# Patient Record
Sex: Female | Born: 1995 | Race: White | Hispanic: No | Marital: Single | Smoking: Current every day smoker
Health system: Southern US, Community
[De-identification: ages and names within clinical notes are randomized; demographics above are authoritative.]

## PROBLEM LIST (undated history)

## (undated) DIAGNOSIS — O99345 Other mental disorders complicating the puerperium: Principal | ICD-10-CM

## (undated) DIAGNOSIS — F419 Anxiety disorder, unspecified: Secondary | ICD-10-CM

## (undated) DIAGNOSIS — F53 Postpartum depression: Secondary | ICD-10-CM

## (undated) HISTORY — DX: Other mental disorders complicating the puerperium: O99.345

## (undated) HISTORY — PX: TONSILLECTOMY: SUR1361

## (undated) HISTORY — PX: MYRINGOTOMY: SUR874

## (undated) HISTORY — DX: Postpartum depression: F53.0

---
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
## 2014-01-11 ENCOUNTER — Encounter: Payer: Self-pay | Admitting: Obstetrics & Gynecology

## 2014-01-11 ENCOUNTER — Ambulatory Visit (INDEPENDENT_AMBULATORY_CARE_PROVIDER_SITE_OTHER): Payer: Medicaid Other | Admitting: Obstetrics & Gynecology

## 2014-01-11 VITALS — BP 110/60 | Wt 112.0 lb

## 2014-01-11 DIAGNOSIS — Z3400 Encounter for supervision of normal first pregnancy, unspecified trimester: Secondary | ICD-10-CM

## 2014-01-11 DIAGNOSIS — Z1389 Encounter for screening for other disorder: Secondary | ICD-10-CM

## 2014-01-11 DIAGNOSIS — Z331 Pregnant state, incidental: Secondary | ICD-10-CM

## 2014-01-11 DIAGNOSIS — IMO0002 Reserved for concepts with insufficient information to code with codable children: Secondary | ICD-10-CM

## 2014-01-11 DIAGNOSIS — O365990 Maternal care for other known or suspected poor fetal growth, unspecified trimester, not applicable or unspecified: Secondary | ICD-10-CM

## 2014-01-11 LAB — POCT URINALYSIS DIPSTICK
Blood, UA: NEGATIVE
Glucose, UA: NEGATIVE
Ketones, UA: NEGATIVE
Nitrite, UA: NEGATIVE

## 2014-01-11 MED ORDER — METRONIDAZOLE 500 MG PO TABS: 500.0000 mg | ORAL_TABLET | Freq: Two times a day (BID) | ORAL | Status: DC

## 2014-01-11 NOTE — Progress Notes (Signed)
G1P0 [redacted]w[redacted]d Estimated Date of Delivery: 02/19/14  Blood pressure 110/60, weight 112 lb (50.803 kg), last menstrual period 05/15/2013.   BP weight and urine results all reviewed and noted.  Please refer to the obstetrical flow sheet for the fundal height and fetal heart rate documentation:  Patient reports good fetal movement, denies any bleeding and no rupture of membranes symptoms or regular contractions. Patient is without complaints. All questions were answered.  Having a foul smelling discharge, will treat with metronidazole 500 BID x 7 days FH is still lagging, sonogram at 30 weeks 24%tile, will recheck, weight gain has stalled  Plan:  Continued routine obstetrical care, recheck sonogram for growth  Follow up in 2 weeks for OB appointment, + sonogram

## 2014-01-24 ENCOUNTER — Encounter: Payer: Medicaid Other | Admitting: Women's Health

## 2014-01-24 ENCOUNTER — Other Ambulatory Visit: Payer: Medicaid Other

## 2014-01-25 ENCOUNTER — Other Ambulatory Visit: Payer: Medicaid Other

## 2014-01-25 ENCOUNTER — Encounter: Payer: Medicaid Other | Admitting: Advanced Practice Midwife

## 2014-01-29 ENCOUNTER — Encounter: Payer: Medicaid Other | Admitting: Obstetrics & Gynecology

## 2014-01-29 ENCOUNTER — Other Ambulatory Visit: Payer: Medicaid Other

## 2014-02-02 ENCOUNTER — Other Ambulatory Visit: Payer: Self-pay | Admitting: Obstetrics & Gynecology

## 2014-02-02 ENCOUNTER — Encounter: Payer: Self-pay | Admitting: Obstetrics & Gynecology

## 2014-02-02 ENCOUNTER — Ambulatory Visit (INDEPENDENT_AMBULATORY_CARE_PROVIDER_SITE_OTHER): Payer: Medicaid Other | Admitting: Obstetrics & Gynecology

## 2014-02-02 ENCOUNTER — Ambulatory Visit (INDEPENDENT_AMBULATORY_CARE_PROVIDER_SITE_OTHER): Payer: Medicaid Other

## 2014-02-02 VITALS — BP 120/80 | Wt 116.0 lb

## 2014-02-02 DIAGNOSIS — Z1389 Encounter for screening for other disorder: Secondary | ICD-10-CM

## 2014-02-02 DIAGNOSIS — O365990 Maternal care for other known or suspected poor fetal growth, unspecified trimester, not applicable or unspecified: Secondary | ICD-10-CM

## 2014-02-02 DIAGNOSIS — IMO0002 Reserved for concepts with insufficient information to code with codable children: Secondary | ICD-10-CM

## 2014-02-02 DIAGNOSIS — Z331 Pregnant state, incidental: Secondary | ICD-10-CM

## 2014-02-02 DIAGNOSIS — Z3403 Encounter for supervision of normal first pregnancy, third trimester: Secondary | ICD-10-CM

## 2014-02-02 DIAGNOSIS — O09899 Supervision of other high risk pregnancies, unspecified trimester: Secondary | ICD-10-CM

## 2014-02-02 LAB — POCT URINALYSIS DIPSTICK
Blood, UA: NEGATIVE
Glucose, UA: NEGATIVE
Ketones, UA: NEGATIVE
NITRITE UA: NEGATIVE
Protein, UA: NEGATIVE

## 2014-02-02 LAB — OB RESULTS CONSOLE GC/CHLAMYDIA
CHLAMYDIA, DNA PROBE: NEGATIVE
GC PROBE AMP, GENITAL: NEGATIVE

## 2014-02-02 NOTE — Progress Notes (Signed)
U/S(37+4wks)-vtx active fetus EFw 5 lb 7 oz (5th%tile), fluid WNL AFI-10.0cm SDP-3.3cm, female fetus, FHR-122 bpm, UA Doppler RI-0.59 & 0.49, BPP 8/8

## 2014-02-02 NOTE — Progress Notes (Signed)
High Risk Pregnancy Diagnosis(es):   IUGR  G1P0 442w4d Estimated Date of Delivery: 02/19/14  Blood pressure 120/80, weight 116 lb (52.617 kg), last menstrual period 05/15/2013.  Urinalysis: Negative   HPI: Sonogram is reviewed and report done.  IUGR with no associated complicating factors.  Induction scheduled for 02/12/2014     BP weight and urine results all reviewed and noted. Patient reports good fetal movement, denies any bleeding and no rupture of membranes symptoms or regular contractions.  Fundal Height:  31 Fetal Heart rate:  122 Edema:  none  Patient is without complaints. All questions were answered.  Assessment:  9342w4d,   IUGR  Medication(s) Plans:  na  Treatment Plan:  Twice weekly assessments and induction at 39 weeks, scheduled 02/12/2014  Follow up on Monday for NST + visit and sonogram next Thursday

## 2014-02-03 LAB — GC/CHLAMYDIA PROBE AMP
CT Probe RNA: NEGATIVE
GC Probe RNA: NEGATIVE

## 2014-02-03 LAB — STREP B DNA PROBE: STREP GROUP B AG: NOT DETECTED

## 2014-02-05 ENCOUNTER — Ambulatory Visit (INDEPENDENT_AMBULATORY_CARE_PROVIDER_SITE_OTHER): Payer: Medicaid Other | Admitting: Obstetrics & Gynecology

## 2014-02-05 ENCOUNTER — Encounter: Payer: Self-pay | Admitting: Obstetrics & Gynecology

## 2014-02-05 ENCOUNTER — Other Ambulatory Visit: Payer: Medicaid Other | Admitting: Obstetrics & Gynecology

## 2014-02-05 ENCOUNTER — Other Ambulatory Visit: Payer: Medicaid Other

## 2014-02-05 ENCOUNTER — Other Ambulatory Visit: Payer: Self-pay | Admitting: *Deleted

## 2014-02-05 VITALS — BP 110/80 | Wt 116.0 lb

## 2014-02-05 DIAGNOSIS — Z283 Underimmunization status: Secondary | ICD-10-CM

## 2014-02-05 DIAGNOSIS — IMO0002 Reserved for concepts with insufficient information to code with codable children: Secondary | ICD-10-CM

## 2014-02-05 DIAGNOSIS — O09899 Supervision of other high risk pregnancies, unspecified trimester: Secondary | ICD-10-CM

## 2014-02-05 DIAGNOSIS — Z3403 Encounter for supervision of normal first pregnancy, third trimester: Secondary | ICD-10-CM

## 2014-02-05 DIAGNOSIS — Z1389 Encounter for screening for other disorder: Secondary | ICD-10-CM

## 2014-02-05 DIAGNOSIS — Z331 Pregnant state, incidental: Secondary | ICD-10-CM

## 2014-02-05 DIAGNOSIS — O365990 Maternal care for other known or suspected poor fetal growth, unspecified trimester, not applicable or unspecified: Secondary | ICD-10-CM

## 2014-02-05 LAB — POCT URINALYSIS DIPSTICK
Glucose, UA: NEGATIVE
Ketones, UA: NEGATIVE
NITRITE UA: NEGATIVE
Protein, UA: NEGATIVE
RBC UA: NEGATIVE

## 2014-02-05 MED ORDER — ZOLPIDEM TARTRATE 10 MG PO TABS: 10.0000 mg | ORAL_TABLET | Freq: Every evening | ORAL | Status: DC | PRN

## 2014-02-05 NOTE — Progress Notes (Signed)
Reactive NST   High Risk Pregnancy Diagnosis(es):   IUGR without complicating factors  G1P0 4047w0d Estimated Date of Delivery: 02/19/14  Blood pressure 110/80, weight 116 lb (52.617 kg), last menstrual period 05/15/2013.  Urinalysis: Negative   HPI: See below     BP weight and urine results all reviewed and noted. Patient reports good fetal movement, denies any bleeding and no rupture of membranes symptoms or regular contractions.  Fundal Height:  33 Fetal Heart rate:  135 Edema:  none  Patient is without complaints. All questions were answered.  Assessment:  8047w0d,   IUGR  Medication(s) Plans:  ambien 10 mg qhs prn sleep  Treatment Plan:  Induction next week, sonogram + ob visit thursday  Follow up in thursday for OB appt, sonogram

## 2014-02-07 ENCOUNTER — Encounter (HOSPITAL_COMMUNITY): Payer: Self-pay | Admitting: *Deleted

## 2014-02-07 ENCOUNTER — Telehealth (HOSPITAL_COMMUNITY): Payer: Self-pay | Admitting: *Deleted

## 2014-02-07 NOTE — Telephone Encounter (Signed)
Preadmission screen  

## 2014-02-08 ENCOUNTER — Other Ambulatory Visit: Payer: Self-pay | Admitting: Obstetrics & Gynecology

## 2014-02-08 ENCOUNTER — Ambulatory Visit (INDEPENDENT_AMBULATORY_CARE_PROVIDER_SITE_OTHER): Payer: Medicaid Other | Admitting: Obstetrics & Gynecology

## 2014-02-08 ENCOUNTER — Ambulatory Visit (INDEPENDENT_AMBULATORY_CARE_PROVIDER_SITE_OTHER): Payer: Medicaid Other

## 2014-02-08 ENCOUNTER — Encounter: Payer: Self-pay | Admitting: Obstetrics & Gynecology

## 2014-02-08 VITALS — BP 110/70 | Wt 117.0 lb

## 2014-02-08 DIAGNOSIS — IMO0002 Reserved for concepts with insufficient information to code with codable children: Secondary | ICD-10-CM

## 2014-02-08 DIAGNOSIS — Z1389 Encounter for screening for other disorder: Secondary | ICD-10-CM

## 2014-02-08 DIAGNOSIS — Z331 Pregnant state, incidental: Secondary | ICD-10-CM

## 2014-02-08 DIAGNOSIS — O365990 Maternal care for other known or suspected poor fetal growth, unspecified trimester, not applicable or unspecified: Secondary | ICD-10-CM

## 2014-02-08 DIAGNOSIS — O09899 Supervision of other high risk pregnancies, unspecified trimester: Secondary | ICD-10-CM

## 2014-02-08 LAB — POCT URINALYSIS DIPSTICK
Glucose, UA: NEGATIVE
Ketones, UA: NEGATIVE
Nitrite, UA: NEGATIVE
Protein, UA: NEGATIVE

## 2014-02-08 MED ORDER — FLUCONAZOLE 150 MG PO TABS: 150.0000 mg | ORAL_TABLET | Freq: Once | ORAL | Status: DC

## 2014-02-08 NOTE — Progress Notes (Signed)
U/S(38+3wks)-vtx active fetus, BPP 8/8, fluid WNL AFI-10.2cm SDP-4.7cm, posterior/fundal Gr 2 placenta, FHR-150 & 126 bpm, UA Doppler RI-0.66 & 0.63

## 2014-02-08 NOTE — Progress Notes (Signed)
Sonogram is reviewed and read, report done  Induction set for 02/12/2014     High Risk Pregnancy Diagnosis(es):   IUGR  G1P0 4061w3d Estimated Date of Delivery: 02/19/14  Blood pressure 110/70, weight 117 lb (53.071 kg), last menstrual period 05/15/2013.  Urinalysis: Negative   HPI: Sonogram is ok for continuation of pregnancy until induction date     BP weight and urine results all reviewed and noted. Patient reports good fetal movement, denies any bleeding and no rupture of membranes symptoms or regular contractions.  Fundal Height:  32  Fetal Heart rate:  150 Edema:  trace  Patient is without complaints. All questions were answered.  Assessment:  5461w3d,   IUGR  Medication(s) Plans:    Treatment Plan:  Ripen as planned  PP 6 weeks

## 2014-02-12 ENCOUNTER — Inpatient Hospital Stay (HOSPITAL_COMMUNITY)
Admission: RE | Admit: 2014-02-12 | Discharge: 2014-02-15 | DRG: 775 | Disposition: A | Discharge status: Home / Self Care | Payer: Medicaid Other | Source: Ambulatory Visit | Attending: Obstetrics & Gynecology | Admitting: Obstetrics & Gynecology

## 2014-02-12 ENCOUNTER — Encounter (HOSPITAL_COMMUNITY): Payer: Self-pay

## 2014-02-12 VITALS — BP 112/64 | HR 78 | Temp 98.3°F | Resp 16 | Ht 66.0 in | Wt 117.0 lb

## 2014-02-12 DIAGNOSIS — Z3490 Encounter for supervision of normal pregnancy, unspecified, unspecified trimester: Secondary | ICD-10-CM

## 2014-02-12 DIAGNOSIS — Z283 Underimmunization status: Secondary | ICD-10-CM

## 2014-02-12 DIAGNOSIS — Z3403 Encounter for supervision of normal first pregnancy, third trimester: Secondary | ICD-10-CM

## 2014-02-12 DIAGNOSIS — O365990 Maternal care for other known or suspected poor fetal growth, unspecified trimester, not applicable or unspecified: Secondary | ICD-10-CM | POA: Diagnosis present

## 2014-02-12 DIAGNOSIS — O09899 Supervision of other high risk pregnancies, unspecified trimester: Secondary | ICD-10-CM

## 2014-02-12 LAB — RAPID URINE DRUG SCREEN, HOSP PERFORMED
Amphetamines: NOT DETECTED
BARBITURATES: NOT DETECTED
Benzodiazepines: NOT DETECTED
Cocaine: NOT DETECTED
Opiates: NOT DETECTED
Tetrahydrocannabinol: NOT DETECTED

## 2014-02-12 LAB — CBC
HEMATOCRIT: 35.4 % — AB (ref 36.0–46.0)
Hemoglobin: 12.1 g/dL (ref 12.0–15.0)
MCH: 32.1 pg (ref 26.0–34.0)
MCHC: 34.2 g/dL (ref 30.0–36.0)
MCV: 93.9 fL (ref 78.0–100.0)
PLATELETS: 159 10*3/uL (ref 150–400)
RBC: 3.77 MIL/uL — AB (ref 3.87–5.11)
RDW: 13.5 % (ref 11.5–15.5)
WBC: 9.8 10*3/uL (ref 4.0–10.5)

## 2014-02-12 LAB — RPR

## 2014-02-12 MED ORDER — EPHEDRINE 5 MG/ML INJ
10.0000 mg | INTRAVENOUS | Status: DC | PRN
  Filled 2014-02-12: qty 2

## 2014-02-12 MED ORDER — ACETAMINOPHEN 325 MG PO TABS
650.0000 mg | ORAL_TABLET | ORAL | Status: DC | PRN
  Administered 2014-02-12: 650 mg via ORAL
  Filled 2014-02-12: qty 2

## 2014-02-12 MED ORDER — FENTANYL CITRATE 0.05 MG/ML IJ SOLN
100.0000 ug | INTRAMUSCULAR | Status: DC | PRN
  Administered 2014-02-12 (×2): 100 ug via INTRAVENOUS
  Filled 2014-02-12 (×2): qty 2

## 2014-02-12 MED ORDER — BUTORPHANOL TARTRATE 1 MG/ML IJ SOLN
INTRAMUSCULAR | Status: AC
  Filled 2014-02-12: qty 1

## 2014-02-12 MED ORDER — TERBUTALINE SULFATE 1 MG/ML IJ SOLN: 0.2500 mg | Freq: Once | INTRAMUSCULAR | Status: AC | PRN

## 2014-02-12 MED ORDER — CITRIC ACID-SODIUM CITRATE 334-500 MG/5ML PO SOLN: 30.0000 mL | ORAL | Status: DC | PRN

## 2014-02-12 MED ORDER — OXYCODONE-ACETAMINOPHEN 5-325 MG PO TABS: 1.0000 | ORAL_TABLET | ORAL | Status: DC | PRN

## 2014-02-12 MED ORDER — OXYTOCIN 40 UNITS IN LACTATED RINGERS INFUSION - SIMPLE MED: 62.5000 mL/h | INTRAVENOUS | Status: DC

## 2014-02-12 MED ORDER — DIPHENHYDRAMINE HCL 50 MG/ML IJ SOLN
12.5000 mg | INTRAMUSCULAR | Status: DC | PRN
  Administered 2014-02-13 (×2): 12.5 mg via INTRAVENOUS
  Filled 2014-02-12 (×2): qty 1

## 2014-02-12 MED ORDER — OXYTOCIN BOLUS FROM INFUSION: 500.0000 mL | INTRAVENOUS | Status: DC

## 2014-02-12 MED ORDER — LACTATED RINGERS IV SOLN
INTRAVENOUS | Status: DC
  Administered 2014-02-12 – 2014-02-13 (×2): via INTRAVENOUS

## 2014-02-12 MED ORDER — FENTANYL 2.5 MCG/ML BUPIVACAINE 1/10 % EPIDURAL INFUSION (WH - ANES)
14.0000 mL/h | INTRAMUSCULAR | Status: DC | PRN
  Administered 2014-02-13 (×2): 14 mL/h via EPIDURAL
  Filled 2014-02-12 (×2): qty 125

## 2014-02-12 MED ORDER — ONDANSETRON HCL 4 MG/2ML IJ SOLN: 4.0000 mg | Freq: Four times a day (QID) | INTRAMUSCULAR | Status: DC | PRN

## 2014-02-12 MED ORDER — MISOPROSTOL 25 MCG QUARTER TABLET
25.0000 ug | ORAL_TABLET | ORAL | Status: DC | PRN
  Administered 2014-02-12: 25 ug via VAGINAL
  Filled 2014-02-12: qty 0.25

## 2014-02-12 MED ORDER — BUTORPHANOL TARTRATE 1 MG/ML IJ SOLN
1.0000 mg | Freq: Once | INTRAMUSCULAR | Status: AC
  Administered 2014-02-12: 1 mg via INTRAVENOUS

## 2014-02-12 MED ORDER — IBUPROFEN 600 MG PO TABS
600.0000 mg | ORAL_TABLET | Freq: Four times a day (QID) | ORAL | Status: DC | PRN
  Administered 2014-02-13: 600 mg via ORAL
  Filled 2014-02-12: qty 1

## 2014-02-12 MED ORDER — PHENYLEPHRINE 40 MCG/ML (10ML) SYRINGE FOR IV PUSH (FOR BLOOD PRESSURE SUPPORT)
80.0000 ug | PREFILLED_SYRINGE | INTRAVENOUS | Status: DC | PRN
  Filled 2014-02-12: qty 2
  Filled 2014-02-12: qty 10

## 2014-02-12 MED ORDER — LIDOCAINE HCL (PF) 1 % IJ SOLN
30.0000 mL | INTRAMUSCULAR | Status: DC | PRN
  Filled 2014-02-12: qty 30

## 2014-02-12 MED ORDER — OXYTOCIN 40 UNITS IN LACTATED RINGERS INFUSION - SIMPLE MED
1.0000 m[IU]/min | INTRAVENOUS | Status: DC
  Administered 2014-02-13: 2 m[IU]/min via INTRAVENOUS
  Filled 2014-02-12: qty 1000

## 2014-02-12 MED ORDER — FLEET ENEMA 7-19 GM/118ML RE ENEM: 1.0000 | ENEMA | RECTAL | Status: DC | PRN

## 2014-02-12 MED ORDER — PHENYLEPHRINE 40 MCG/ML (10ML) SYRINGE FOR IV PUSH (FOR BLOOD PRESSURE SUPPORT)
80.0000 ug | PREFILLED_SYRINGE | INTRAVENOUS | Status: DC | PRN
  Filled 2014-02-12: qty 2

## 2014-02-12 MED ORDER — LACTATED RINGERS IV SOLN
500.0000 mL | Freq: Once | INTRAVENOUS | Status: AC
  Administered 2014-02-13: 500 mL via INTRAVENOUS

## 2014-02-12 MED ORDER — LACTATED RINGERS IV SOLN: 500.0000 mL | INTRAVENOUS | Status: DC | PRN

## 2014-02-12 NOTE — Progress Notes (Signed)
I consulted with pt's RN, Venda RodesJennifer Cox and with CSW, Francesca JewettCollen Shaw.  I did not meet with patient today, but we remain available for emotional and spiritual support if additional support is needed for patient or for adoptive parents.  [address] Roosevelt StreetChaplain Katy Bateslandlaussen Pager, [phone] 4:28 PM   02/12/14 1600  Clinical Encounter Type  Visited With Health care provider (RN, Venda RodesJennifer Cox, CSW, Plankintonolleen Shaw)

## 2014-02-12 NOTE — Progress Notes (Signed)
CSW received call from alleged FOB's mother/Melinda Ernestine Mcmurrayngle who questioned CSW about fathers' rights.  Ms. Ernestine Mcmurrayngle was not requesting any information about patient and understood that CSW could only speak in general terms regarding adoptive situations.  CSW informed Ms. Ernestine Mcmurrayngle that her son has paternal rights if he is the biological father.  CSW advised she and her son speak to a lawyer regarding how to go about establishing paternity and provided her with phone numbers for Legal Aid and [facility].  Ms. Ernestine Mcmurrayngle is not in support of patient's decision to make an adoption plan and states she and her son want to raise the baby.  CSW has concerns that this baby will be in the middle of a disagreement between many parties at birth.  CSW will follow up tomorrow regarding baby's disposition.

## 2014-02-12 NOTE — H&P (Signed)
[name] [name] is a 18 y.o. female G1P0 with IUP at 5321w0d presenting for IOL 2/2 symmetric IUGR. Baby up for adoption. Pt states she has been having none contractions, associated with none vaginal bleeding.  Membranes are intact, with active fetal movement.   [facility]  since 13 wks  Prenatal History/Complications: Pt. Is a 18 y/o G1 at 39w here for IOL 2/2 symmetric IUGR. She has +FM, no bleeding, no LOF. She denies fever, chills, nausea, vomiting, or other systemic signs / symptoms. She is planning to put baby up for adoption. Adoptive parents to be at delivery. She has no other complications. She says that she does not smoke, or use other illicit substances. She has no other medical problems, and is only taking her prenatal vitamins. She is unsure of who the father is at this time. She has no other complaints.   Past Medical History: History reviewed. No pertinent past medical history.  Past Surgical History: Past Surgical History  Procedure Laterality Date  . Tonsillectomy      Obstetrical History: OB History   Grav Para Term Preterm Abortions TAB SAB Ect Mult Living   1         0      Gynecological History: OB History   Grav Para Term Preterm Abortions TAB SAB Ect Mult Living   1         0      Social History: History   Social History  . Marital Status: Single    Spouse Name: N/A    Number of Children: N/A  . Years of Education: N/A   Social History Main Topics  . Smoking status: Never Smoker   . Smokeless tobacco: Never Used  . Alcohol Use: No  . Drug Use: No  . Sexual Activity: Yes    Birth Control/ Protection: None   Other Topics Concern  . None   Social History Narrative  . None    Family History: Family History  Problem Relation Age of Onset  . Cancer Maternal Grandfather   . Alcohol abuse Neg Hx   . Arthritis Neg Hx   . Asthma Neg Hx   . COPD Neg Hx   . Birth defects Neg Hx   . Depression Neg Hx   . Diabetes Neg Hx   . Drug abuse  Neg Hx   . Early death Neg Hx   . Hearing loss Neg Hx   . Heart disease Neg Hx   . Hyperlipidemia Neg Hx   . Hypertension Neg Hx   . Kidney disease Neg Hx   . Learning disabilities Neg Hx   . Mental illness Neg Hx   . Miscarriages / Stillbirths Neg Hx   . Mental retardation Neg Hx   . Vision loss Neg Hx   . Stroke Neg Hx   . Varicose Veins Neg Hx     Allergies: No Known Allergies  Prescriptions prior to admission  Medication Sig Dispense Refill  . acetaminophen (TYLENOL) 325 MG tablet Take 650 mg by mouth every 6 (six) hours as needed for mild pain or headache.      . Prenatal Vit-Fe Fumarate-FA (PRENATAL MULTIVITAMIN) TABS tablet Take 1 tablet by mouth daily at 12 noon.         Review of Systems   Constitutional: per HPI above.   Blood pressure 115/75, pulse 89, temperature 98.2 F (36.8 C), temperature source Oral, resp. rate 18, height 5\' 6"  (1.676 m), weight 53.071 kg (117  lb), last menstrual period 05/15/2013. General appearance: alert, cooperative and no distress Lungs: clear to auscultation bilaterally Heart: regular rate and rhythm Abdomen: soft, non-tender; bowel sounds normal Pelvic: 1cm, 50%, -3 station Extremities: Homans sign is negative, no sign of DVT Neurologically grossly intact.  Presentation: cephalic Fetal monitoringBaseline: 140's bpm, Variability: Good {> 6 bpm) and Accelerations: Reactive Uterine activityNone Dilation: 1 Effacement (%): 50 Station: -2 Exam by:: Von Inscoe, CNM   Prenatal labs: ABO, Rh: O/POS/-- (01/05 1458) Antibody: NEG (04/23 1134) Rubella:   RPR: NON REAC (04/23 1134)  HBsAg: NEGATIVE (01/05 1458)  HIV: NONREACTIVE (04/23 1134)  GBS: NOT DETECTED (06/26 1329)  1 hr Glucola - Neg Genetic screening  - Neg Anatomy US - Normal female   Prenatal Transfer Tool  Maternal Diabetes: No Genetic Screening: Normal Maternal Ultrasounds/Referrals: Normal Fetal Ultrasounds or other Referrals:  None Maternal Substance Abuse:   No Significant Maternal Medications:  None Significant Maternal Lab Results: Lab values include: Group B Strep negative     Results for orders placed during the hospital encounter of 02/12/14 (from the past 24 hour(s))  CBC   Collection Time    02/12/14  8:00 AM      Result Value Ref Range   WBC 9.8  4.0 - 10.5 K/uL   RBC 3.77 (*) 3.87 - 5.11 MIL/uL   Hemoglobin 12.1  12.0 - 15.0 g/dL   HCT 78.235.4 (*) 95.636.0 - 21.346.0 %   MCV 93.9  78.0 - 100.0 fL   MCH 32.1  26.0 - 34.0 pg   MCHC 34.2  30.0 - 36.0 g/dL   RDW 08.613.5  57.811.5 - 46.915.5 %   Platelets 159  150 - 400 K/uL  URINE RAPID DRUG SCREEN (HOSP PERFORMED)   Collection Time    02/12/14  9:10 AM      Result Value Ref Range   Opiates NONE DETECTED  NONE DETECTED   Cocaine NONE DETECTED  NONE DETECTED   Benzodiazepines NONE DETECTED  NONE DETECTED   Amphetamines NONE DETECTED  NONE DETECTED   Tetrahydrocannabinol NONE DETECTED  NONE DETECTED   Barbiturates NONE DETECTED  NONE DETECTED    Assessment: [name] [name] is a 18 y.o. G1P0 at 1452w0d by L= 9 wk u/s here for IOL 2/2 symmetric IUGR #Labor: Plan for cervical ripening, with subsequent foley bulb placement, and augmentation with pitocin if necessary. Progression to NSVD. #Pain: Fentanyl, Epidural #FWB: Cat I #ID:  GBS Negative #MOF: N/A #MOC: Depo #Circ:  Adoptive Parents to [facility]  Melancon, Hillery HunterCaleb G 02/12/2014, 1:33 PM  Evaluation and management procedures were performed by Resident physician under my supervision/collaboration. Chart reviewed, patient examined by me and I agree with management and plan.

## 2014-02-12 NOTE — Progress Notes (Signed)
Sena HitchOlivia Thorman is a 18 y.o. G1P0 at 5459w0d by LMP admitted for induction of labor due to symmetric IUGR.  Subjective: Pt. Is complaining of more pain now, and is contracting more frequently q 1-4 minutes. She says that she is tired. She has no other complaints.   Objective: BP 115/75  Pulse 89  Temp(Src) 98.2 F (36.8 C) (Oral)  Resp 18  Ht 5\' 6"  (1.676 m)  Wt 53.071 kg (117 lb)  BMI 18.89 kg/m2  LMP 05/15/2013      FHT:  FHR: 130's bpm, variability: moderate,  accelerations:  Present,  decelerations:  Absent UC:   regular, every 1-4 minutes SVE:   Dilation: 1.5 Effacement (%): 80 Station: -2 Exam by:: Kimorah Ridolfi, CNM  Labs: Lab Results  Component Value Date   WBC 9.8 02/12/2014   HGB 12.1 02/12/2014   HCT 35.4* 02/12/2014   MCV 93.9 02/12/2014   PLT 159 02/12/2014    Assessment / Plan: Induction of Labor 2/2 IUGR progressing well. Holding pitocin for now.   Labor: Progressing normally membranes stripped at last check. Will continue to monitor for progression. Foley bulb if unchanged at next exam.  Preeclampsia:  no signs or symptoms of toxicity Fetal Wellbeing:  Category I Pain Control:  Epidural and Fentanyl I/D:  n/a Anticipated MOD:  NSVD  Melancon, Caleb G 02/12/2014, 2:09 PM Evaluation and management procedures were performed by Resident physician under my supervision/collaboration. Chart reviewed, patient examined by me and I agree with management and plan.

## 2014-02-12 NOTE — Progress Notes (Signed)
Patient ID: [name] [name], female   DOB: 12/01/1995, 18 y.o.   MRN: [id number] [name] [name] is a 18 y.o. G1P0 at 6126w0d admitted for IOL indicated by FGR  Subjective: Had Fentanyl x 1 with some pain relief.  Objective: BP 106/66  Pulse 80  Temp(Src) 98.2 F (36.8 C) (Oral)  Resp 18  Ht 5\' 6"  (1.676 m)  Wt 53.071 kg (117 lb)  BMI 18.89 kg/m2  LMP 05/15/2013  Appears comfortable  Fetal Heart FHR: 120 bpm, variability: moderate,  accelerations:  Present,  decelerations:  Absent   Contractions: irregular  SVE:   Dilation: 1.5 Effacement (%): 80 Station: -2 Exam by:: Lindsey Demonte, CNM Cx posterior, unchanged from last exam> Foley Bulb inserted without difficulty  Assessment / Plan:  Labor: cervical ripening Fetal Wellbeing: category 1 Pain Control:  adequate Expected mode of delivery: NSVD  [name] [name] 02/12/2014, 4:29 PM
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
## 2014-02-12 NOTE — Progress Notes (Signed)
Sena HitchOlivia [name] is a 18 y.o. G1P0 at 1614w0d admitted for IUGR.    Subjective: Pt reports mild cramping. She is undecided about whether she is giving baby up for adoption.   Objective: BP 108/86  Pulse 83  Temp(Src) 98.5 F (36.9 C) (Oral)  Resp 18  Ht 5\' 6"  (1.676 m)  Wt 53.071 kg (117 lb)  BMI 18.89 kg/m2  LMP 05/15/2013      FHT:  FHR: 120 bpm, variability: moderate,  accelerations:  Present,  decelerations:  Absent UC:   irregular, every 1-3.5 minutes, mild to palpation SVE:   Dilation: 5 Effacement (%): 50 Station: -2 Exam by:: Leftwich-Kirby, CNM Foley bulb removed without difficulty during cervical exam.   Labs: Lab Results  Component Value Date   WBC 9.8 02/12/2014   HGB 12.1 02/12/2014   HCT 35.4* 02/12/2014   MCV 93.9 02/12/2014   PLT 159 02/12/2014    Assessment / Plan: Induction of labor due to IUGR S/P Foley Bulb Pt to eat, shower, then start Pitocin 1 hour after food  Labor: Progressing normally Preeclampsia:  n/a Fetal Wellbeing:  Category I Pain Control:  Labor support without medications I/D:  n/a Anticipated MOD:  NSVD  LEFTWICH-KIRBY, LISA 02/12/2014, 11:28 PM

## 2014-02-13 ENCOUNTER — Encounter (HOSPITAL_COMMUNITY): Payer: Self-pay

## 2014-02-13 ENCOUNTER — Encounter (HOSPITAL_COMMUNITY): Payer: Medicaid Other | Admitting: Anesthesiology

## 2014-02-13 ENCOUNTER — Inpatient Hospital Stay (HOSPITAL_COMMUNITY): Payer: Medicaid Other | Admitting: Anesthesiology

## 2014-02-13 DIAGNOSIS — Z3490 Encounter for supervision of normal pregnancy, unspecified, unspecified trimester: Secondary | ICD-10-CM

## 2014-02-13 DIAGNOSIS — O365990 Maternal care for other known or suspected poor fetal growth, unspecified trimester, not applicable or unspecified: Secondary | ICD-10-CM

## 2014-02-13 MED ORDER — DIPHENHYDRAMINE HCL 25 MG PO CAPS: 25.0000 mg | ORAL_CAPSULE | Freq: Four times a day (QID) | ORAL | Status: DC | PRN

## 2014-02-13 MED ORDER — SIMETHICONE 80 MG PO CHEW: 80.0000 mg | CHEWABLE_TABLET | ORAL | Status: DC | PRN

## 2014-02-13 MED ORDER — DIBUCAINE 1 % RE OINT: 1.0000 "application " | TOPICAL_OINTMENT | RECTAL | Status: DC | PRN

## 2014-02-13 MED ORDER — ZOLPIDEM TARTRATE 5 MG PO TABS: 5.0000 mg | ORAL_TABLET | Freq: Every evening | ORAL | Status: DC | PRN

## 2014-02-13 MED ORDER — LANOLIN HYDROUS EX OINT: TOPICAL_OINTMENT | CUTANEOUS | Status: DC | PRN

## 2014-02-13 MED ORDER — TERBUTALINE SULFATE 1 MG/ML IJ SOLN: 0.2500 mg | Freq: Once | INTRAMUSCULAR | Status: DC | PRN

## 2014-02-13 MED ORDER — OXYTOCIN 40 UNITS IN LACTATED RINGERS INFUSION - SIMPLE MED
1.0000 m[IU]/min | INTRAVENOUS | Status: DC
Start: 2014-02-13 — End: 2014-02-13

## 2014-02-13 MED ORDER — SENNOSIDES-DOCUSATE SODIUM 8.6-50 MG PO TABS
2.0000 | ORAL_TABLET | ORAL | Status: DC
  Administered 2014-02-15: 2 via ORAL
  Filled 2014-02-13 (×2): qty 2

## 2014-02-13 MED ORDER — OXYCODONE-ACETAMINOPHEN 5-325 MG PO TABS
1.0000 | ORAL_TABLET | ORAL | Status: DC | PRN
  Administered 2014-02-13: 1 via ORAL
  Administered 2014-02-14 (×2): 2 via ORAL
  Administered 2014-02-15: 1 via ORAL
  Filled 2014-02-13: qty 2
  Filled 2014-02-13 (×2): qty 1
  Filled 2014-02-13: qty 2

## 2014-02-13 MED ORDER — WITCH HAZEL-GLYCERIN EX PADS: 1.0000 "application " | MEDICATED_PAD | CUTANEOUS | Status: DC | PRN

## 2014-02-13 MED ORDER — TETANUS-DIPHTH-ACELL PERTUSSIS 5-2.5-18.5 LF-MCG/0.5 IM SUSP
0.5000 mL | Freq: Once | INTRAMUSCULAR | Status: AC
  Administered 2014-02-15: 0.5 mL via INTRAMUSCULAR
  Filled 2014-02-13: qty 0.5

## 2014-02-13 MED ORDER — ONDANSETRON HCL 4 MG PO TABS: 4.0000 mg | ORAL_TABLET | ORAL | Status: DC | PRN

## 2014-02-13 MED ORDER — PRENATAL MULTIVITAMIN CH
1.0000 | ORAL_TABLET | Freq: Every day | ORAL | Status: DC
  Administered 2014-02-14 – 2014-02-15 (×2): 1 via ORAL
  Filled 2014-02-13 (×2): qty 1

## 2014-02-13 MED ORDER — BENZOCAINE-MENTHOL 20-0.5 % EX AERO
1.0000 "application " | INHALATION_SPRAY | CUTANEOUS | Status: DC | PRN
  Administered 2014-02-13: 1 via TOPICAL
  Filled 2014-02-13: qty 56

## 2014-02-13 MED ORDER — ONDANSETRON HCL 4 MG/2ML IJ SOLN: 4.0000 mg | INTRAMUSCULAR | Status: DC | PRN

## 2014-02-13 MED ORDER — NALBUPHINE HCL 10 MG/ML IJ SOLN
2.5000 mg | Freq: Once | INTRAMUSCULAR | Status: AC
  Administered 2014-02-13: 2.5 mg via INTRAVENOUS
  Filled 2014-02-13: qty 1

## 2014-02-13 MED ORDER — IBUPROFEN 600 MG PO TABS
600.0000 mg | ORAL_TABLET | Freq: Four times a day (QID) | ORAL | Status: DC
  Administered 2014-02-13 – 2014-02-15 (×8): 600 mg via ORAL
  Filled 2014-02-13 (×8): qty 1

## 2014-02-13 MED ORDER — LIDOCAINE HCL (PF) 1 % IJ SOLN
INTRAMUSCULAR | Status: DC | PRN
  Administered 2014-02-13 (×4): 4 mL

## 2014-02-13 MED ORDER — MEASLES, MUMPS & RUBELLA VAC ~~LOC~~ INJ
0.5000 mL | INJECTION | Freq: Once | SUBCUTANEOUS | Status: DC
  Filled 2014-02-13: qty 0.5

## 2014-02-13 NOTE — Progress Notes (Signed)
CSW met with MOB in her first floor room/139 to follow up on extensive involvement yesterday while patient was in L&D.  MOB states she is not sure what she plans to do, but is fairly certain she is no longer making an adoption plan.  CSW feels MOB is having a difficult time processing her feelings related to the situation and is concerned about her decision making ability.  When asked a question she replies, "yeah.  It's difficult."  Please do not consider baby for early discharge.  CSW will follow up in the am.

## 2014-02-13 NOTE — Progress Notes (Signed)
UR completed 

## 2014-02-13 NOTE — Progress Notes (Addendum)
[name] [name] is a 18 y.o. G1P0 at 7050w1d admitted for induction of labor due to IUGR.  Subjective: Pt comfortable with epidural.  Family in room for support.   Objective: BP 108/62  Pulse 76  Temp(Src) 98.4 F (36.9 C) (Oral)  Resp 18  Ht 5\' 6"  (1.676 m)  Wt 53.071 kg (117 lb)  BMI 18.89 kg/m2  LMP 05/15/2013      FHT:  FHR: 125 bpm, variability: moderate,  accelerations:  Present,  decelerations:  Present early UC:   regular, every 3-4 minutes SVE:   Dilation: 6 Effacement (%): 90 Station: -1 Exam by:: A.Davis,RN  Labs: Lab Results  Component Value Date   WBC 9.8 02/12/2014   HGB 12.1 02/12/2014   HCT 35.4* 02/12/2014   MCV 93.9 02/12/2014   PLT 159 02/12/2014    Assessment / Plan: Induction of labor due to IUGR,  progressing well on pitocin  Labor: Progressing normally Preeclampsia:  n/a Fetal Wellbeing:  Category I Pain Control:  Epidural I/D:  n/a Anticipated MOD:  NSVD  LEFTWICH-KIRBY, Laporsche Hoeger 02/13/2014, 7:18 AM

## 2014-02-13 NOTE — H&P (Signed)
Attestation of Attending Supervision of Advanced Practitioner (PA/CNM/NP): Evaluation and management procedures were performed by the Advanced Practitioner under my supervision and collaboration.  I have reviewed the Advanced Practitioner's note and chart, and I agree with the management and plan.  PRATT,TANYA S, MD [facility] Attending 02/13/2014 8:28 AM   

## 2014-02-13 NOTE — Anesthesia Procedure Notes (Signed)
Epidural Patient location during procedure: OB Start time: 02/13/2014 1:05 AM  Staffing Performed by: anesthesiologist   Preanesthetic Checklist Completed: patient identified, site marked, surgical consent, pre-op evaluation, timeout performed, IV checked, risks and benefits discussed and monitors and equipment checked  Epidural Patient position: sitting Prep: site prepped and draped and DuraPrep Patient monitoring: continuous pulse ox and blood pressure Approach: midline Injection technique: LOR air  Needle:  Needle type: Tuohy  Needle gauge: 17 G Needle length: 9 cm and 9 Needle insertion depth: 3.5 cm Catheter type: closed end flexible Catheter size: 19 Gauge Catheter at skin depth: 8.5 cm Test dose: negative  Assessment Events: blood not aspirated, injection not painful, no injection resistance, negative IV test and no paresthesia  Additional Notes Discussed risk of headache, infection, bleeding, nerve injury and failed or incomplete block.  Patient voices understanding and wishes to proceed.  Epidural placed easily on first attempt.  No paresthesia.  Patient tolerated procedure well with no apparent complications.  Jasmine DecemberA. Nahsir Venezia, MDReason for block:procedure for pain

## 2014-02-13 NOTE — Anesthesia Preprocedure Evaluation (Signed)
Anesthesia Evaluation  Patient identified by MRN, date of birth, ID band Patient awake    Reviewed: Allergy & Precautions, H&P , NPO status , Patient's Chart, lab work & pertinent test results, reviewed documented beta blocker date and time   History of Anesthesia Complications Negative for: history of anesthetic complications  Airway Mallampati: I TM Distance: >3 FB Neck ROM: full    Dental  (+) Teeth Intact   Pulmonary neg pulmonary ROS,  breath sounds clear to auscultation        Cardiovascular negative cardio ROS  Rhythm:regular Rate:Normal     Neuro/Psych negative neurological ROS  negative psych ROS   GI/Hepatic negative GI ROS, (+)       marijuana use,   Endo/Other  negative endocrine ROS  Renal/GU negative Renal ROS     Musculoskeletal   Abdominal   Peds  Hematology negative hematology ROS (+)   Anesthesia Other Findings   Reproductive/Obstetrics (+) Pregnancy (IUGR, BUFA)                           Anesthesia Physical Anesthesia Plan  ASA: II  Anesthesia Plan: Epidural   Post-op Pain Management:    Induction:   Airway Management Planned:   Additional Equipment:   Intra-op Plan:   Post-operative Plan:   Informed Consent: I have reviewed the patients History and Physical, chart, labs and discussed the procedure including the risks, benefits and alternatives for the proposed anesthesia with the patient or authorized representative who has indicated his/her understanding and acceptance.     Plan Discussed with:   Anesthesia Plan Comments:         Anesthesia Quick Evaluation

## 2014-02-14 NOTE — Progress Notes (Signed)
I have seen and examined this patient and I agree with the above. No longer pursuing adoption. SW already involved. Cam HaiSHAW, KIMBERLY CNM 8:52 AM 02/14/2014
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
## 2014-02-14 NOTE — Progress Notes (Signed)
Psychosocial assessment completed.  No barriers to discharge.  Full documentation to follow. 

## 2014-02-14 NOTE — Anesthesia Postprocedure Evaluation (Signed)
  Anesthesia Post-op Note  Patient: [name] [name]  Procedure(s) Performed: * No procedures listed *  Patient Location: Mother/Baby  Anesthesia Type:Epidural  Level of Consciousness: awake  Airway and Oxygen Therapy: Patient Spontanous Breathing  Post-op Pain: mild  Post-op Assessment: Patient's Cardiovascular Status Stable and Respiratory Function Stable  Post-op Vital Signs: stable  Last Vitals:  Filed Vitals:   02/14/14 0523  BP: 99/60  Pulse: 78  Temp: 36.5 C  Resp: 18    Complications: No apparent anesthesia complications

## 2014-02-14 NOTE — Discharge Instructions (Signed)
Vaginal Delivery °Care After °Refer to this sheet in the next few weeks. These discharge instructions provide you with information on caring for yourself after delivery. Your caregiver may also give you specific instructions. Your treatment has been planned according to the most current medical practices available, but problems sometimes occur. Call your caregiver if you have any problems or questions after you go home. °HOME CARE INSTRUCTIONS °· Take over-the-counter or prescription medicines only as directed by your caregiver or pharmacist. °· Do not drink alcohol, especially if you are breastfeeding or taking medicine to relieve pain. °· Do not chew or smoke tobacco. °· Do not use illegal drugs. °· Continue to use good perineal care. Good perineal care includes: °¨ Wiping your perineum from front to back. °¨ Keeping your perineum clean. °· Do not use tampons or douche until your caregiver says it is okay. °· Shower, wash your hair, and take tub baths as directed by your caregiver. °· Wear a well-fitting bra that provides breast support. °· Eat healthy foods. °· Drink enough fluids to keep your urine clear or pale yellow. °· Eat high-fiber foods such as whole grain cereals and breads, brown rice, beans, and fresh fruits and vegetables every day. These foods may help prevent or relieve constipation. °· Follow your cargiver's recommendations regarding resumption of activities such as climbing stairs, driving, lifting, exercising, or traveling. °· Talk to your caregiver about resuming sexual activities. Resumption of sexual activities is dependent upon your risk of infection, your rate of healing, and your comfort and desire to resume sexual activity. °· Try to have someone help you with your household activities and your newborn for at least a few days after you leave the hospital. °· Rest as much as possible. Try to rest or take a nap when your newborn is sleeping. °· Increase your activities gradually. °· Keep all  of your scheduled postpartum appointments. It is very important to keep your scheduled follow-up appointments. At these appointments, your caregiver will be checking to make sure that you are healing physically and emotionally. °SEEK MEDICAL CARE IF:  °· You are passing large clots from your vagina. Save any clots to show your caregiver. °· You have a foul smelling discharge from your vagina. °· You have trouble urinating. °· You are urinating frequently. °· You have pain when you urinate. °· You have a change in your bowel movements. °· You have increasing redness, pain, or swelling near your vaginal incision (episiotomy) or vaginal tear. °· You have pus draining from your episiotomy or vaginal tear. °· Your episiotomy or vaginal tear is separating. °· You have painful, hard, or reddened breasts. °· You have a severe headache. °· You have blurred vision or see spots. °· You feel sad or depressed. °· You have thoughts of hurting yourself or your newborn. °· You have questions about your care, the care of your newborn, or medicines. °· You are dizzy or lightheaded. °· You have a rash. °· You have nausea or vomiting. °· You were breastfeeding and have not had a menstrual period within 12 weeks after you stopped breastfeeding. °· You are not breastfeeding and have not had a menstrual period by the 12th week after delivery. °· You have a fever. °SEEK IMMEDIATE MEDICAL CARE IF:  °· You have persistent pain. °· You have chest pain. °· You have shortness of breath. °· You faint. °· You have leg pain. °· You have stomach pain. °· Your vaginal bleeding saturates two or more sanitary pads   in 1 hour. °MAKE SURE YOU:  °· Understand these instructions. °· Will watch your condition. °· Will get help right away if you are not doing well or get worse. ° ° °Document Released: 07/24/2000 Document Revised: 04/20/2012 Document Reviewed: 03/23/2012 °ExitCare® Patient Information ©2015 [employer]. This information is not intended to  replace advice given to you by your health care provider. Make sure you discuss any questions you have with your health care provider. ° °

## 2014-02-14 NOTE — Progress Notes (Signed)
Post Partum Day 1  Subjective: up ad lib, voiding, tolerating PO, + flatus and pain 8/10  Objective: Blood pressure 99/60, pulse 78, temperature 97.7 F (36.5 C), temperature source Oral, resp. rate 18, height 5\' 6"  (1.676 m), weight 53.071 kg (117 lb), last menstrual period 05/15/2013, SpO2 97.00%, bottle feeding.  Physical Exam:  General: alert and cooperative Lochia: appropriate Uterine Fundus: firm Incision: N/A  DVT Evaluation: No evidence of DVT seen on physical exam. Negative Homan's sign. No cords or calf tenderness. No significant calf/ankle edema.   Recent Labs  02/12/14 0800  HGB 12.1  HCT 35.4*    Assessment/Plan: Plan for discharge tomorrow and Contraception Depo vs Mirena  LOS: 2 days   [name] [name], [name] Fesler PA-S 02/14/2014, 7:25 AM

## 2014-02-15 MED ORDER — IBUPROFEN 600 MG PO TABS: 600.0000 mg | ORAL_TABLET | Freq: Four times a day (QID) | ORAL | Status: DC

## 2014-02-15 NOTE — Discharge Summary (Signed)
Obstetric Discharge Summary Reason for Admission: induction of labor 2/2 IUGR symmetric Prenatal Procedures: NST Intrapartum Procedures: spontaneous vaginal delivery Postpartum Procedures: none Complications-Operative and Postpartum: none Hemoglobin  Date Value Ref Range Status  02/12/2014 12.1  12.0 - 15.0 g/dL Final     HCT  Date Value Ref Range Status  02/12/2014 35.4* 36.0 - 46.0 % Final   Hospital Course:  Pt. Is a 18 y/o G1P1001 at 1127w1d who presented for IOL 2/2 Symmetric IUGR. She was induced with cytotec and pitocin in the usual manner. She proceeded to NSVD without complication. Her intrapartum course was uncomplicated. She delivered without incident. Her postpartum course remains uncomplicated. She is tolerating po, ambulating, and pain controlled with tapered bleeding. She is stable and ready for discharge. She is bottle feeding and desires mirena for contraception.   Delivery Note  At 9:57 AM a viable female was delivered via NSVD (Presentation: ; ). APGAR:8 ,9 ; weight 6lb 6oz.  Placenta status: intact, with a 3 vessel cord. Without any complications.  Anesthesia: Epidural  Episiotomy: None  Lacerations: none Suture Repair: N/A  Est. Blood Loss (mL): 250cc  Mom to postpartum. Baby to Nursery.  Called to delivery. Mother pushed over 15 min. Infant delivered to bed, and then to warmer per mom's request. Cord clamped and cut. Active management of 3rd stage with traction and Pitocin. Placenta delivered intact with 3v cord. No complications.  EBL250cc. Counts correct. Hemostatic.  Yolande Jollyaleb G Melancon, MD  Resident  Melancon, Hillery Hunteraleb G  02/13/2014, 10:14 AM  I was present for and supervised the delivery of this newborn. I agree with above.  BECK, KELI L, MD   Physical Exam:  General: alert, cooperative and no distress Lochia: appropriate Uterine Fundus: firm Incision: N/A DVT Evaluation: No evidence of DVT seen on physical exam. No cords or calf tenderness.  Discharge  Diagnoses: Term Pregnancy-delivered  Discharge Information: Date: 02/15/2014 Activity: unrestricted and pelvic rest Diet: routine Medications: PNV and Ibuprofen Condition: stable Instructions: refer to practice specific booklet Discharge to: home Follow-up Information   [facility]. (@10am , please keep post-partum appointment)    Specialty:  Obstetrics and Gynecology   Contact information:   [address] [place] [address] [phone]      Newborn Data: Live born female  Birth Weight: 6 lb 3 oz (2807 g) APGAR: 8, 9  Home with mother.  Melancon, Hillery HunterCaleb G 02/15/2014, 7:52 AM  I spoke with and examined patient and agree with resident's note and plan of care.  Tawana ScaleMichael Ryan Kristeena Meineke, MD OB Fellow 02/15/2014 9:29 AM

## 2014-02-19 ENCOUNTER — Ambulatory Visit (INDEPENDENT_AMBULATORY_CARE_PROVIDER_SITE_OTHER): Payer: Medicaid Other | Admitting: Adult Health

## 2014-02-19 ENCOUNTER — Telehealth: Payer: Self-pay | Admitting: *Deleted

## 2014-02-19 ENCOUNTER — Encounter: Payer: Self-pay | Admitting: Adult Health

## 2014-02-19 VITALS — BP 120/70 | Ht 65.0 in | Wt 103.0 lb

## 2014-02-19 DIAGNOSIS — O99345 Other mental disorders complicating the puerperium: Secondary | ICD-10-CM

## 2014-02-19 DIAGNOSIS — Z8759 Personal history of other complications of pregnancy, childbirth and the puerperium: Secondary | ICD-10-CM

## 2014-02-19 DIAGNOSIS — F53 Postpartum depression: Secondary | ICD-10-CM

## 2014-02-19 DIAGNOSIS — Z8659 Personal history of other mental and behavioral disorders: Secondary | ICD-10-CM | POA: Insufficient documentation

## 2014-02-19 HISTORY — DX: Other mental disorders complicating the puerperium: O99.345

## 2014-02-19 HISTORY — DX: Postpartum depression: F53.0

## 2014-02-19 MED ORDER — ESCITALOPRAM OXALATE 10 MG PO TABS: 10.0000 mg | ORAL_TABLET | Freq: Every day | ORAL | Status: DC

## 2014-02-19 NOTE — Patient Instructions (Signed)
Postpartum Depression and Baby Blues The postpartum period begins right after the birth of a baby. During this time, there is often a great amount of joy and excitement. It is also a time of many changes in the life of the parents. Regardless of how many times a mother gives birth, each child brings new challenges and dynamics to the family. It is not unusual to have feelings of excitement along with confusing shifts in moods, emotions, and thoughts. All mothers are at risk of developing postpartum depression or the "baby blues." These mood changes can occur right after giving birth, or they may occur many months after giving birth. The baby blues or postpartum depression can be mild or severe. Additionally, postpartum depression can go away rather quickly, or it can be a long-term condition.  CAUSES Raised hormone levels and the rapid drop in those levels are thought to be a main cause of postpartum depression and the baby blues. A number of hormones change during and after pregnancy. Estrogen and progesterone usually decrease right after the delivery of your baby. The levels of thyroid hormone and various cortisol steroids also rapidly drop. Other factors that play a role in these mood changes include major life events and genetics.  RISK FACTORS If you have any of the following risks for the baby blues or postpartum depression, know what symptoms to watch out for during the postpartum period. Risk factors that may increase the likelihood of getting the baby blues or postpartum depression include:  Having a personal or family history of depression.   Having depression while being pregnant.   Having premenstrual mood issues or mood issues related to oral contraceptives.  Having a lot of life stress.   Having marital conflict.   Lacking a social support network.   Having a baby with special needs.   Having health problems, such as diabetes.  SIGNS AND SYMPTOMS Symptoms of baby blues  include:  Brief changes in mood, such as going from extreme happiness to sadness.  Decreased concentration.   Difficulty sleeping.   Crying spells, tearfulness.   Irritability.   Anxiety.  Symptoms of postpartum depression typically begin within the first month after giving birth. These symptoms include:  Difficulty sleeping or excessive sleepiness.   Marked weight loss.   Agitation.   Feelings of worthlessness.   Lack of interest in activity or food.  Postpartum psychosis is a very serious condition and can be dangerous. Fortunately, it is rare. Displaying any of the following symptoms is cause for immediate medical attention. Symptoms of postpartum psychosis include:   Hallucinations and delusions.   Bizarre or disorganized behavior.   Confusion or disorientation.  DIAGNOSIS  A diagnosis is made by an evaluation of your symptoms. There are no medical or lab tests that lead to a diagnosis, but there are various questionnaires that a health care provider may use to identify those with the baby blues, postpartum depression, or psychosis. Often, a screening tool called the Edinburgh Postnatal Depression Scale is used to diagnose depression in the postpartum period.  TREATMENT The baby blues usually goes away on its own in 1-2 weeks. Social support is often all that is needed. You will be encouraged to get adequate sleep and rest. Occasionally, you may be given medicines to help you sleep.  Postpartum depression requires treatment because it can last several months or longer if it is not treated. Treatment may include individual or group therapy, medicine, or both to address any social, physiological, and psychological   factors that may play a role in the depression. Regular exercise, a healthy diet, rest, and social support may also be strongly recommended.  Postpartum psychosis is more serious and needs treatment right away. Hospitalization is often needed. HOME CARE  INSTRUCTIONS  Get as much rest as you can. Nap when the baby sleeps.   Exercise regularly. Some women find yoga and walking to be beneficial.   Eat a balanced and nourishing diet.   Do little things that you enjoy. Have a cup of tea, take a bubble bath, read your favorite magazine, or listen to your favorite music.  Avoid alcohol.   Ask for help with household chores, cooking, grocery shopping, or running errands as needed. Do not try to do everything.   Talk to people close to you about how you are feeling. Get support from your partner, family members, friends, or other new moms.  Try to stay positive in how you think. Think about the things you are grateful for.   Do not spend a lot of time alone.   Only take over-the-counter or prescription medicine as directed by your health care provider.  Keep all your postpartum appointments.   Let your health care provider know if you have any concerns.  SEEK MEDICAL CARE IF: You are having a reaction to or problems with your medicine. SEEK IMMEDIATE MEDICAL CARE IF:  You have suicidal feelings.   You think you may harm the baby or someone else. MAKE SURE YOU:  Understand these instructions.  Will watch your condition.  Will get help right away if you are not doing well or get worse. Document Released: 04/30/2004 Document Revised: 08/01/2013 Document Reviewed: 05/08/2013 [facility] Patient Information 2015 [place] [place]. This information is not intended to replace advice given to you by your health care provider. Make sure you discuss any questions you have with your health care provider. Take lexapro daily Follow up in  2 weeks Call with any questions Go to ER if feels worse

## 2014-02-19 NOTE — Telephone Encounter (Signed)
Pt delivered on 02/13/2014 SVD. Pt complaining crying a lot, isolation, "not happy with self." Pt states NO thoughts of harming self, baby, or others. Appointment made for today with Cyril MourningJennifer Griffin, NP at 3:30 pm. Pt states will try to get a ride if unable will call our office back to reschedule.

## 2014-02-19 NOTE — Progress Notes (Signed)
Subjective:     Patient ID: [name] [name], female   DOB: 01/18/1996, 18 y.o.   MRN: [id number]  HPI [name] [name] is a 18 year old white female in complaining of depression.She had a vaginal delivery 02/13/14 baby girl Abran Cantorngel Grace, 6 lbs 3 ozs, she is not breastfeeding.She says she has headache and is not eating well.She is not happy and is teary a lot.Has not had sex. This was a work in visit.  Review of Systems See HPI for positives Reviewed past medical,surgical, social and family history. Reviewed medications and allergies.     Objective:   Physical Exam BP 120/70  Ht 5\' 5"  (1.651 m)  Wt 103 lb (46.72 kg)  BMI 17.14 kg/m2  Breastfeeding? No   EPDS 20, pt denies any suicidal ideations, discussed trying lexapro for the depression and she agrees, and discussed with Dr Despina HiddenEure.  Assessment:    Postpartum depression    Plan:     Rx Lexapro 10 mg #30 1 daily with 6 refills Follow up in 2 weeks, will offer counseling if not better Review handout on postpartum depression Go to ER if worse Call with any concerns

## 2014-03-05 ENCOUNTER — Ambulatory Visit: Payer: Medicaid Other | Admitting: Adult Health

## 2014-03-07 ENCOUNTER — Ambulatory Visit: Payer: Medicaid Other | Admitting: Adult Health

## 2014-03-12 ENCOUNTER — Ambulatory Visit: Payer: Medicaid Other | Admitting: Adult Health

## 2014-03-14 ENCOUNTER — Ambulatory Visit (INDEPENDENT_AMBULATORY_CARE_PROVIDER_SITE_OTHER): Payer: Medicaid Other | Admitting: Adult Health

## 2014-03-14 ENCOUNTER — Encounter: Payer: Self-pay | Admitting: Adult Health

## 2014-03-14 VITALS — BP 110/70 | Ht 65.0 in | Wt 104.0 lb

## 2014-03-14 DIAGNOSIS — O99345 Other mental disorders complicating the puerperium: Secondary | ICD-10-CM

## 2014-03-14 DIAGNOSIS — F53 Postpartum depression: Secondary | ICD-10-CM

## 2014-03-14 NOTE — Progress Notes (Signed)
Subjective:     Patient ID: [name] [name], female   DOB: 08/30/1995, 18 y.o.   MRN: [id number]  HPI [name] [name] is a 18 year old white female in for follow up for starting lexapro.and she says she is better.  Review of Systems See HPI Reviewed past medical,surgical, social and family history. Reviewed medications and allergies.     Objective:   Physical Exam BP 110/70  Ht 5\' 5"  (1.651 m)  Wt 104 lb (47.174 kg)  BMI 17.31 kg/m2   EPDS 16 today was 20 02/19/14, she says she feels better and is bonding with baby still has teary moments.Denies any suicidial ideation.  Assessment:     Postpartum depression    Plan:    Declines counseling for now Continue lexapro Follow up in 2 weeks for postaprtum

## 2014-03-14 NOTE — Patient Instructions (Signed)
Continue lexapro Return in 2 weeks for postpartum visit

## 2014-03-22 ENCOUNTER — Ambulatory Visit (INDEPENDENT_AMBULATORY_CARE_PROVIDER_SITE_OTHER): Payer: Medicaid Other | Admitting: Advanced Practice Midwife

## 2014-03-22 ENCOUNTER — Encounter: Payer: Self-pay | Admitting: Advanced Practice Midwife

## 2014-03-22 MED ORDER — ESCITALOPRAM OXALATE 10 MG PO TABS: 20.0000 mg | ORAL_TABLET | Freq: Every day | ORAL | Status: DC

## 2014-03-22 MED ORDER — MEDROXYPROGESTERONE ACETATE 150 MG/ML IM SUSP: 150.0000 mg | INTRAMUSCULAR | Status: DC

## 2014-03-22 NOTE — Progress Notes (Signed)
  [name] HitchOlivia [name] is a 18 y.o. who presents for a postpartum visit. She is 5 weeks postpartum following a spontaneous vaginal delivery. I have fully reviewed the prenatal and intrapartum course. The delivery was at 39.1 gestational weeks.  Anesthesia: epidural. Postpartum course has been complicated by postpartum depression. She was started on Lexapro 10mg  a month ago. Baby's course has been uneventful. Baby is feeding by bottle. Bleeding: staining only. Bowel function is normal. Bladder fun ction is normal. Patient is not sexually active. Contraception method is abstinence. Postpartum depression screening: improved.  Feels better, but wants to increase dosage.  Declines counseling d/t transportation issues.  Feels like her mom is stressing her out, too.    Review of Systems   Constitutional: Negative for fever and chills Eyes: Negative for visual disturbances Respiratory: Negative for shortness of breath, dyspnea Cardiovascular: Negative for chest pain or palpitations  Gastrointestinal: Negative for vomiting, diarrhea and constipation Genitourinary: Negative for dysuria and urgency Musculoskeletal: Negative for back pain, joint pain, myalgias  Neurological: Negative for dizziness and headaches   Objective:     Filed Vitals:   03/22/14 1010  BP: 102/70   General:  alert, cooperative and no distress   Breasts:  negative  Lungs: clear to auscultation bilaterally  Heart:  regular rate and rhythm  Abdomen: Soft, nontender   Vulva:  normal  Vagina: normal vagina  Cervix:  closed  Corpus: Well involuted     Rectal Exam: no hemorrhoids        Assessment:    normal postpartum exam postpartum depression, improving.  Plan:    1. Contraception: Depo-Provera injections Increase LExapro to 20mg  [name] 2. Follow up in: later today for depo  or as needed.

## 2014-04-12 ENCOUNTER — Encounter: Payer: Self-pay | Admitting: Advanced Practice Midwife

## 2014-04-12 ENCOUNTER — Other Ambulatory Visit: Payer: Medicaid Other

## 2014-04-12 ENCOUNTER — Ambulatory Visit (INDEPENDENT_AMBULATORY_CARE_PROVIDER_SITE_OTHER): Payer: Medicaid Other | Admitting: Advanced Practice Midwife

## 2014-04-12 DIAGNOSIS — Z3202 Encounter for pregnancy test, result negative: Secondary | ICD-10-CM

## 2014-04-12 DIAGNOSIS — Z3042 Encounter for surveillance of injectable contraceptive: Secondary | ICD-10-CM

## 2014-04-12 DIAGNOSIS — Z3200 Encounter for pregnancy test, result unknown: Secondary | ICD-10-CM

## 2014-04-12 DIAGNOSIS — Z3049 Encounter for surveillance of other contraceptives: Secondary | ICD-10-CM

## 2014-04-12 LAB — POCT URINE PREGNANCY: Preg Test, Ur: NEGATIVE

## 2014-04-12 LAB — HCG, QUANTITATIVE, PREGNANCY

## 2014-04-12 MED ORDER — MEDROXYPROGESTERONE ACETATE 150 MG/ML IM SUSP
150.0000 mg | Freq: Once | INTRAMUSCULAR | Status: AC
  Administered 2014-04-12: 150 mg via INTRAMUSCULAR

## 2014-05-22 ENCOUNTER — Telehealth: Payer: Self-pay | Admitting: Obstetrics & Gynecology

## 2014-05-22 MED ORDER — ZOLPIDEM TARTRATE 10 MG PO TABS: 10.0000 mg | ORAL_TABLET | Freq: Every evening | ORAL | Status: AC | PRN

## 2014-05-22 NOTE — Telephone Encounter (Signed)
Pt requesting refill on Ambien, states Dr. Despina HiddenEure and prescribed in the past.

## 2014-05-24 ENCOUNTER — Telehealth: Payer: Self-pay | Admitting: *Deleted

## 2014-05-24 NOTE — Telephone Encounter (Signed)
Pt informed did get prior auth today for Ambien. Pt aware will be Monday before Dr. Despina HiddenEure can review.

## 2014-06-11 ENCOUNTER — Encounter: Payer: Self-pay | Admitting: Advanced Practice Midwife

## 2014-07-09 ENCOUNTER — Encounter: Payer: Medicaid Other | Admitting: Adult Health

## 2014-07-09 NOTE — Progress Notes (Signed)
Pt didn't have shot with her and she couldn't go to pharmacy and come back. Appt rescheduled for tomorrow. JSY

## 2014-07-09 NOTE — Progress Notes (Signed)
This encounter was created in error - please disregard.

## 2014-07-10 ENCOUNTER — Ambulatory Visit: Payer: Medicaid Other

## 2014-08-08 ENCOUNTER — Other Ambulatory Visit: Payer: Medicaid Other

## 2014-08-08 ENCOUNTER — Encounter: Payer: Self-pay | Admitting: Obstetrics & Gynecology

## 2014-08-08 ENCOUNTER — Ambulatory Visit: Payer: Medicaid Other

## 2014-08-16 ENCOUNTER — Telehealth: Payer: Self-pay | Admitting: *Deleted

## 2014-08-16 ENCOUNTER — Ambulatory Visit: Payer: Medicaid Other

## 2014-08-16 NOTE — Telephone Encounter (Signed)
Spoke with pt. Pt got 1st Depo shot 04/12/14 but didn't come back for her next shot. Pt had sex in November, but not in December or this month so far. Pt concerned that she hasn't had a period yet. I advised it could be that the Depo is not 100% out of her system. I advised to give it another month, and if she hasn't started period, call us back. Pt voiced understanding. JSY

## 2014-08-20 ENCOUNTER — Other Ambulatory Visit: Payer: Medicaid Other

## 2014-08-20 ENCOUNTER — Ambulatory Visit: Payer: Medicaid Other

## 2014-09-06 ENCOUNTER — Other Ambulatory Visit: Payer: Self-pay | Admitting: *Deleted

## 2014-09-06 DIAGNOSIS — Z3042 Encounter for surveillance of injectable contraceptive: Secondary | ICD-10-CM

## 2014-09-07 ENCOUNTER — Ambulatory Visit: Payer: Medicaid Other

## 2014-09-13 ENCOUNTER — Other Ambulatory Visit: Payer: Medicaid Other

## 2014-09-13 ENCOUNTER — Encounter: Payer: Self-pay | Admitting: *Deleted

## 2014-09-13 ENCOUNTER — Ambulatory Visit (INDEPENDENT_AMBULATORY_CARE_PROVIDER_SITE_OTHER): Payer: Medicaid Other | Admitting: *Deleted

## 2014-09-13 DIAGNOSIS — Z3042 Encounter for surveillance of injectable contraceptive: Secondary | ICD-10-CM

## 2014-09-13 LAB — HCG, QUANTITATIVE, PREGNANCY: hCG Quant: <1 m[IU]/mL

## 2014-09-13 MED ORDER — MEDROXYPROGESTERONE ACETATE 150 MG/ML IM SUSP
150.0000 mg | Freq: Once | INTRAMUSCULAR | Status: AC
  Administered 2014-09-13: 150 mg via INTRAMUSCULAR

## 2014-09-13 NOTE — Progress Notes (Signed)
Pt here for Depo shot. Pt states she was due her Depo in November and didn't get shot. Pt states she never started her period. I advised it can take a while for period to return. Pt voiced understanding. To return in 12 weeks for next shot. JSY

## 2014-11-01 ENCOUNTER — Encounter (HOSPITAL_COMMUNITY): Payer: Self-pay | Admitting: Emergency Medicine

## 2014-11-01 ENCOUNTER — Emergency Department (HOSPITAL_COMMUNITY)
Admission: EM | Admit: 2014-11-01 | Discharge: 2014-11-01 | Disposition: A | Discharge status: Home / Self Care | Payer: Medicaid Other | Attending: Emergency Medicine | Admitting: Emergency Medicine

## 2014-11-01 DIAGNOSIS — J40 Bronchitis, not specified as acute or chronic: Secondary | ICD-10-CM

## 2014-11-01 DIAGNOSIS — M791 Myalgia: Secondary | ICD-10-CM | POA: Diagnosis not present

## 2014-11-01 DIAGNOSIS — Z79899 Other long term (current) drug therapy: Secondary | ICD-10-CM | POA: Diagnosis not present

## 2014-11-01 DIAGNOSIS — Z8659 Personal history of other mental and behavioral disorders: Secondary | ICD-10-CM | POA: Diagnosis not present

## 2014-11-01 DIAGNOSIS — Z720 Tobacco use: Secondary | ICD-10-CM | POA: Diagnosis not present

## 2014-11-01 DIAGNOSIS — J209 Acute bronchitis, unspecified: Secondary | ICD-10-CM | POA: Insufficient documentation

## 2014-11-01 DIAGNOSIS — R05 Cough: Secondary | ICD-10-CM | POA: Diagnosis present

## 2014-11-01 MED ORDER — ALBUTEROL SULFATE HFA 108 (90 BASE) MCG/ACT IN AERS
2.0000 | INHALATION_SPRAY | Freq: Once | RESPIRATORY_TRACT | Status: AC
  Administered 2014-11-01: 2 via RESPIRATORY_TRACT
  Filled 2014-11-01: qty 6.7

## 2014-11-01 MED ORDER — AZITHROMYCIN 250 MG PO TABS
500.0000 mg | ORAL_TABLET | Freq: Once | ORAL | Status: AC
  Administered 2014-11-01: 500 mg via ORAL
  Filled 2014-11-01: qty 2

## 2014-11-01 MED ORDER — GUAIFENESIN-CODEINE 100-10 MG/5ML PO SYRP: 10.0000 mL | ORAL_SOLUTION | Freq: Three times a day (TID) | ORAL | Status: DC | PRN

## 2014-11-01 MED ORDER — AZITHROMYCIN 250 MG PO TABS: 250.0000 mg | ORAL_TABLET | Freq: Every day | ORAL | Status: DC

## 2014-11-01 MED ORDER — GUAIFENESIN-CODEINE 100-10 MG/5ML PO SOLN
10.0000 mL | Freq: Once | ORAL | Status: AC
  Administered 2014-11-01: 10 mL via ORAL
  Filled 2014-11-01: qty 10

## 2014-11-01 NOTE — ED Notes (Signed)
Patient c/o cold symptoms x 1 week.

## 2014-11-04 NOTE — ED Provider Notes (Signed)
CSN: [id number]     Arrival date & time 11/01/14  2004 History   First MD Initiated Contact with Patient 11/01/14 2129     Chief Complaint  Patient presents with  . Cough     (Consider location/radiation/quality/duration/timing/severity/associated sxs/prior Treatment) HPI   [name] [name] is a 19 y.o. female who presents to the Emergency Department complaining of cough and cold symptoms for one week.  She reports nasal congestion, fever, chills, generalized body aches for several days that seem to be improving, but reports continued cough that is mostly non-productive.  She has tried several OTC cold medications without relief.  Nothing has made her symptoms better or worse.  She denies abd pain,shortness of breath, chest pain, vomiting or bloody sputum.  Past Medical History  Diagnosis Date  . Medical history non-contributory   . Postpartum depression 02/19/2014   Past Surgical History  Procedure Laterality Date  . Tonsillectomy     Family History  Problem Relation Age of Onset  . Cancer Maternal Grandfather   . Alcohol abuse Neg Hx   . Arthritis Neg Hx   . Asthma Neg Hx   . COPD Neg Hx   . Birth defects Neg Hx   . Depression Neg Hx   . Diabetes Neg Hx   . Drug abuse Neg Hx   . Early death Neg Hx   . Hearing loss Neg Hx   . Heart disease Neg Hx   . Hyperlipidemia Neg Hx   . Hypertension Neg Hx   . Kidney disease Neg Hx   . Learning disabilities Neg Hx   . Mental illness Neg Hx   . Miscarriages / Stillbirths Neg Hx   . Mental retardation Neg Hx   . Vision loss Neg Hx   . Stroke Neg Hx   . Varicose Veins Neg Hx    History  Substance Use Topics  . Smoking status: Current Every Day Smoker -- 0.50 packs/day for 2 years    Types: Cigarettes  . Smokeless tobacco: Never Used  . Alcohol Use: No   OB History    Gravida Para Term Preterm AB TAB SAB Ectopic Multiple Living   Review of Systems  Constitutional: Positive for fever. Negative for  chills and appetite change.  HENT: Positive for congestion. Negative for rhinorrhea, sore throat and trouble swallowing.   Respiratory: Positive for cough. Negative for chest tightness, shortness of breath and wheezing.   Cardiovascular: Negative for chest pain.  Gastrointestinal: Negative for nausea, vomiting and abdominal pain.  Genitourinary: Negative for dysuria.  Musculoskeletal: Positive for myalgias. Negative for arthralgias.  Skin: Negative for rash.  Neurological: Negative for dizziness, weakness and numbness.  Hematological: Negative for adenopathy.  All other systems reviewed and are negative.     Allergies  Review of patient's allergies indicates no known allergies.  Home Medications   Prior to Admission medications   Medication Sig Start Date End Date Taking? Authorizing Provider  azithromycin (ZITHROMAX) 250 MG tablet Take 1 tablet (250 mg total) by mouth daily. Take first 2 tablets together, then 1 every day until finished. 11/01/14   Tammi Khadijatou Borak, PA-C  guaiFENesin-codeine (ROBITUSSIN AC) 100-10 MG/5ML syrup Take 10 mLs by mouth 3 (three) times daily as needed. 11/01/14   Tammi Berton Butrick, PA-C  medroxyPROGESTERone (DEPO-PROVERA) 150 MG/ML injection Inject 1 mL (150 mg total) into the muscle every 3 (three) months. 03/22/14   Jacklyn Shell, CNM  BP 105/84 mmHg  Pulse 90  Temp(Src) 99.7 F (37.6 C) (Oral)  Resp 18  Ht 5\' 6"  (1.676 m)  Wt 94 lb 6.4 oz (42.82 kg)  BMI 15.24 kg/m2  SpO2 98% Physical Exam  Constitutional: She is oriented to person, place, and time. She appears well-developed and well-nourished. No distress.  HENT:  Head: Normocephalic and atraumatic.  Right Ear: Tympanic membrane and ear canal normal.  Left Ear: Tympanic membrane and ear canal normal.  Mouth/Throat: Uvula is midline and mucous membranes are normal. Posterior oropharyngeal erythema present. No oropharyngeal exudate, posterior oropharyngeal edema or tonsillar abscesses.   Eyes: EOM are normal. Pupils are equal, round, and reactive to light.  Neck: Normal range of motion, full passive range of motion without pain and phonation normal. Neck supple.  Cardiovascular: Normal rate, regular rhythm, normal heart sounds and intact distal pulses.   No murmur heard. Pulmonary/Chest: Effort normal. No stridor. No respiratory distress. She has no rales. She exhibits no tenderness.  Coarse lungs sounds bilaterally and few scattered expiratory wheezes bilaterally.  No rales   Abdominal: Soft. She exhibits no distension. There is no guarding.  Musculoskeletal: Normal range of motion. She exhibits no edema.  Lymphadenopathy:    She has no cervical adenopathy.  Neurological: She is alert and oriented to person, place, and time. She exhibits normal muscle tone. Coordination normal.  Skin: Skin is warm and dry.  Nursing note and vitals reviewed.   ED Course  Procedures (including critical care time) Labs Review Labs Reviewed - No data to display  Imaging Review No results found.   EKG Interpretation None      MDM   Final diagnoses:  Bronchitis   Pt is well appearing, non-toxic.  Vitals stable. Albuterol inhaler dispensed.  No tachycardia, tachypnea or hypoxia.  Pt appears stable for d/c, agrees to close PMD f/u or to return here if sx's are worsening.      Severiano Gilbertammi Lavon Horn, PA-C 11/04/14 2016  Donnetta HutchingBrian Cook, MD 11/07/14 [phone]

## 2014-12-06 ENCOUNTER — Ambulatory Visit: Payer: Medicaid Other

## 2014-12-06 ENCOUNTER — Ambulatory Visit (INDEPENDENT_AMBULATORY_CARE_PROVIDER_SITE_OTHER): Payer: Medicaid Other | Admitting: *Deleted

## 2014-12-06 ENCOUNTER — Encounter: Payer: Self-pay | Admitting: *Deleted

## 2014-12-06 DIAGNOSIS — Z3202 Encounter for pregnancy test, result negative: Secondary | ICD-10-CM

## 2014-12-06 DIAGNOSIS — Z3042 Encounter for surveillance of injectable contraceptive: Secondary | ICD-10-CM

## 2014-12-06 LAB — POCT URINE PREGNANCY: Preg Test, Ur: NEGATIVE

## 2014-12-06 MED ORDER — MEDROXYPROGESTERONE ACETATE 150 MG/ML IM SUSP
150.0000 mg | Freq: Once | INTRAMUSCULAR | Status: AC
Start: 2014-12-06 — End: 2014-12-06
  Administered 2014-12-06: 150 mg via INTRAMUSCULAR

## 2014-12-06 NOTE — Progress Notes (Signed)
Pt here for Depo. Reports no problems at this time. Return in 12 weeks for next shot. JSY 

## 2015-02-28 ENCOUNTER — Encounter: Payer: Self-pay | Admitting: Obstetrics & Gynecology

## 2015-02-28 ENCOUNTER — Ambulatory Visit: Payer: Medicaid Other

## 2015-03-05 ENCOUNTER — Ambulatory Visit (INDEPENDENT_AMBULATORY_CARE_PROVIDER_SITE_OTHER): Payer: Medicaid Other | Admitting: *Deleted

## 2015-03-05 DIAGNOSIS — Z3042 Encounter for surveillance of injectable contraceptive: Secondary | ICD-10-CM | POA: Diagnosis not present

## 2015-03-05 DIAGNOSIS — Z3202 Encounter for pregnancy test, result negative: Secondary | ICD-10-CM | POA: Diagnosis not present

## 2015-03-05 LAB — POCT URINE PREGNANCY: PREG TEST UR: NEGATIVE

## 2015-03-05 MED ORDER — MEDROXYPROGESTERONE ACETATE 150 MG/ML IM SUSP
150.0000 mg | Freq: Once | INTRAMUSCULAR | Status: AC
  Administered 2015-03-05: 150 mg via INTRAMUSCULAR

## 2015-03-05 NOTE — Progress Notes (Signed)
Pt here for Depo. Reports no problems at this time. Return in 12 weeks for next shot. TRL 

## 2015-05-27 ENCOUNTER — Other Ambulatory Visit: Payer: Self-pay | Admitting: Advanced Practice Midwife

## 2015-05-28 ENCOUNTER — Encounter: Payer: Self-pay | Admitting: *Deleted

## 2015-05-28 ENCOUNTER — Ambulatory Visit (INDEPENDENT_AMBULATORY_CARE_PROVIDER_SITE_OTHER): Payer: Medicaid Other | Admitting: *Deleted

## 2015-05-28 ENCOUNTER — Telehealth: Payer: Self-pay | Admitting: *Deleted

## 2015-05-28 DIAGNOSIS — Z3202 Encounter for pregnancy test, result negative: Secondary | ICD-10-CM

## 2015-05-28 DIAGNOSIS — Z3042 Encounter for surveillance of injectable contraceptive: Secondary | ICD-10-CM

## 2015-05-28 LAB — POCT URINE PREGNANCY: Preg Test, Ur: NEGATIVE

## 2015-05-28 MED ORDER — MEDROXYPROGESTERONE ACETATE 150 MG/ML IM SUSP
150.0000 mg | Freq: Once | INTRAMUSCULAR | Status: AC
  Administered 2015-05-28: 150 mg via INTRAMUSCULAR

## 2015-05-28 NOTE — Telephone Encounter (Signed)
Depo Provera e-scribed to pt pharmacy today.

## 2015-08-27 ENCOUNTER — Encounter: Payer: Self-pay | Admitting: *Deleted

## 2015-08-27 ENCOUNTER — Ambulatory Visit (INDEPENDENT_AMBULATORY_CARE_PROVIDER_SITE_OTHER): Payer: Medicaid Other | Admitting: *Deleted

## 2015-08-27 DIAGNOSIS — Z3042 Encounter for surveillance of injectable contraceptive: Secondary | ICD-10-CM | POA: Diagnosis not present

## 2015-08-27 DIAGNOSIS — Z3202 Encounter for pregnancy test, result negative: Secondary | ICD-10-CM | POA: Diagnosis not present

## 2015-08-27 LAB — POCT URINE PREGNANCY: Preg Test, Ur: NEGATIVE

## 2015-08-27 MED ORDER — MEDROXYPROGESTERONE ACETATE 150 MG/ML IM SUSP
150.0000 mg | Freq: Once | INTRAMUSCULAR | Status: AC
Start: 2015-08-27 — End: 2015-08-27
  Administered 2015-08-27: 150 mg via INTRAMUSCULAR

## 2015-08-27 NOTE — Progress Notes (Signed)
Pt here for Depo. Reports no problems at this time. Return in 12 weeks for next shot. JSY 

## 2015-11-19 ENCOUNTER — Encounter: Payer: Self-pay | Admitting: *Deleted

## 2015-11-19 ENCOUNTER — Ambulatory Visit (INDEPENDENT_AMBULATORY_CARE_PROVIDER_SITE_OTHER): Payer: Medicaid Other | Admitting: *Deleted

## 2015-11-19 DIAGNOSIS — Z3202 Encounter for pregnancy test, result negative: Secondary | ICD-10-CM | POA: Diagnosis not present

## 2015-11-19 DIAGNOSIS — Z3042 Encounter for surveillance of injectable contraceptive: Secondary | ICD-10-CM | POA: Diagnosis not present

## 2015-11-19 LAB — POCT URINE PREGNANCY: Preg Test, Ur: NEGATIVE

## 2015-11-19 MED ORDER — MEDROXYPROGESTERONE ACETATE 150 MG/ML IM SUSP
150.0000 mg | Freq: Once | INTRAMUSCULAR | Status: AC
  Administered 2015-11-19: 150 mg via INTRAMUSCULAR

## 2015-11-19 NOTE — Progress Notes (Signed)
Pt here for Depo. Pt tolerated shot well. Return in 12 weeks for next shot. JSY 

## 2015-12-08 ENCOUNTER — Encounter (HOSPITAL_COMMUNITY): Payer: Self-pay | Admitting: Emergency Medicine

## 2015-12-08 ENCOUNTER — Emergency Department (HOSPITAL_COMMUNITY)
Admission: EM | Admit: 2015-12-08 | Discharge: 2015-12-08 | Disposition: A | Discharge status: Home / Self Care | Payer: Medicaid Other | Attending: Emergency Medicine | Admitting: Emergency Medicine

## 2015-12-08 DIAGNOSIS — F17210 Nicotine dependence, cigarettes, uncomplicated: Secondary | ICD-10-CM | POA: Diagnosis not present

## 2015-12-08 DIAGNOSIS — R300 Dysuria: Secondary | ICD-10-CM | POA: Diagnosis present

## 2015-12-08 DIAGNOSIS — Z791 Long term (current) use of non-steroidal anti-inflammatories (NSAID): Secondary | ICD-10-CM | POA: Diagnosis not present

## 2015-12-08 DIAGNOSIS — N390 Urinary tract infection, site not specified: Secondary | ICD-10-CM | POA: Diagnosis not present

## 2015-12-08 DIAGNOSIS — R399 Unspecified symptoms and signs involving the genitourinary system: Secondary | ICD-10-CM

## 2015-12-08 LAB — URINE MICROSCOPIC-ADD ON

## 2015-12-08 LAB — URINALYSIS, ROUTINE W REFLEX MICROSCOPIC
Bilirubin Urine: NEGATIVE
Glucose, UA: NEGATIVE mg/dL
Hgb urine dipstick: NEGATIVE
Ketones, ur: NEGATIVE mg/dL
Leukocytes, UA: NEGATIVE
Nitrite: NEGATIVE
Protein, ur: 30 mg/dL — AB
Specific Gravity, Urine: >1.03 — ABNORMAL HIGH (ref 1.005–1.030)
pH: 6 (ref 5.0–8.0)

## 2015-12-08 MED ORDER — NITROFURANTOIN MONOHYD MACRO 100 MG PO CAPS: 100.0000 mg | ORAL_CAPSULE | Freq: Two times a day (BID) | ORAL | Status: DC

## 2015-12-08 MED ORDER — PHENAZOPYRIDINE HCL 100 MG PO TABS
200.0000 mg | ORAL_TABLET | Freq: Once | ORAL | Status: AC
  Administered 2015-12-08: 200 mg via ORAL
  Filled 2015-12-08: qty 2

## 2015-12-08 MED ORDER — PHENAZOPYRIDINE HCL 200 MG PO TABS: 200.0000 mg | ORAL_TABLET | Freq: Three times a day (TID) | ORAL | Status: DC

## 2015-12-08 NOTE — ED Notes (Signed)
Patient c/o dysuria, frequency, and pressure. Per patient hx of UTI.

## 2015-12-08 NOTE — ED Provider Notes (Signed)
CSN: [id number]     Arrival date & time 12/08/15  1832 History  By signing my name below, I, Evon Slack, attest that this documentation has been prepared under the direction and in the presence of Kerrie Buffalo, NP. Electronically Signed: Evon Slack, ED Scribe. 12/08/2015. 7:17 PM.     Chief Complaint  Patient presents with  . Dysuria   Patient is a 20 y.o. female presenting with dysuria. The history is provided by the patient. No language interpreter was used.  Dysuria Pain quality:  Burning Pain severity:  Mild Duration:  1 day Chronicity:  New Relieved by:  None tried Worsened by:  Nothing tried Ineffective treatments:  None tried Urinary symptoms: frequent urination   Urinary symptoms: no hematuria   Associated symptoms: no fever, no flank pain and no vaginal discharge   Risk factors: not sexually active and no sexually transmitted infections    HPI Comments: [name] [name] is a 20 y.o. female who presents to the Emergency Department complaining of dysuria onset today. Pt reports associated urinary urgency and frequency. Pt reports Hx of UTI and that her symptoms are similar to today. Pt denies sexual activity within the last year. Denies Hx of STD's. Pt denies fever or back pain.  [id number]  Past Medical History  Diagnosis Date  . Medical history non-contributory   . Postpartum depression 02/19/2014   Past Surgical History  Procedure Laterality Date  . Tonsillectomy     Family History  Problem Relation Age of Onset  . Cancer Maternal Grandfather   . Alcohol abuse Neg Hx   . Arthritis Neg Hx   . Asthma Neg Hx   . COPD Neg Hx   . Birth defects Neg Hx   . Depression Neg Hx   . Diabetes Neg Hx   . Drug abuse Neg Hx   . Early death Neg Hx   . Hearing loss Neg Hx   . Heart disease Neg Hx   . Hyperlipidemia Neg Hx   . Hypertension Neg Hx   . Kidney disease Neg Hx   . Learning disabilities Neg Hx   . Mental illness Neg Hx   . Miscarriages / Stillbirths Neg  Hx   . Mental retardation Neg Hx   . Vision loss Neg Hx   . Stroke Neg Hx   . Varicose Veins Neg Hx    Social History  Substance Use Topics  . Smoking status: Current Every Day Smoker -- 0.50 packs/day for 2 years    Types: Cigarettes  . Smokeless tobacco: Never Used  . Alcohol Use: No   OB History    Gravida Para Term Preterm AB TAB SAB Ectopic Multiple Living   Review of Systems  Constitutional: Negative for fever.  Genitourinary: Positive for dysuria, urgency and frequency. Negative for flank pain and vaginal discharge.  All other systems reviewed and are negative.    Allergies  Review of patient's allergies indicates no known allergies.  Home Medications   Prior to Admission medications   Medication Sig Start Date End Date Taking? Authorizing Provider  ibuprofen (ADVIL,MOTRIN) 200 MG tablet Take 400 mg by mouth every 6 (six) hours as needed for headache.   Yes Historical Provider, MD  medroxyPROGESTERone (DEPO-PROVERA) 150 MG/ML injection INJECT 1 ML INTO THE MUSCLE EVERY 3 MONTHS 05/28/15   Jacklyn Shell, CNM  nitrofurantoin, macrocrystal-monohydrate, (MACROBID) 100 MG capsule Take 1 capsule (100 mg total)  by mouth 2 (two) times daily. 12/08/15   Lash Matulich Orlene OchM Zula Hovsepian, NP  phenazopyridine (PYRIDIUM) 200 MG tablet Take 1 tablet (200 mg total) by mouth 3 (three) times daily. 12/08/15   Dossie Ocanas Orlene OchM Marnesha Gagen, NP   BP 101/66 mmHg  Pulse 84  Temp(Src) 97.1 F (36.2 C) (Oral)  Resp 20  Ht 5\' 4"  (1.626 m)  Wt 45.677 kg  BMI 17.28 kg/m2  SpO2 100%   Physical Exam  Constitutional: She is oriented to person, place, and time. She appears well-developed and well-nourished. No distress.  HENT:  Head: Normocephalic and atraumatic.  Eyes: Conjunctivae and EOM are normal.  Neck: Neck supple. No tracheal deviation present.  Cardiovascular: Normal rate.   Pulmonary/Chest: Effort normal. No respiratory distress.  Abdominal: Soft. Normal appearance. There is  tenderness in the suprapubic area. There is no rebound, no guarding and no CVA tenderness.  Genitourinary:  Patient declined pelvic exam  Musculoskeletal: Normal range of motion.  Neurological: She is alert and oriented to person, place, and time.  Skin: Skin is warm and dry.  Psychiatric: She has a normal mood and affect. Her behavior is normal.  Nursing note and vitals reviewed.   ED Course  Procedures (including critical care time) DIAGNOSTIC STUDIES: Oxygen Saturation is 100% on RA, normal by my interpretation.    COORDINATION OF CARE: 7:07 PM-Pt decline pelvic exam. She has no concerns for STD due to not being sexually active in 1 year.  7:15 PM-Discussed treatment plan with pt at bedside and pt agreed to plan.     Labs Review Results for orders placed or performed during the hospital encounter of 12/08/15 (from the past 24 hour(s))  Urinalysis, Routine w reflex microscopic ([facility])     Status: Abnormal   Collection Time: 12/08/15  7:17 PM  Result Value Ref Range   Color, Urine YELLOW YELLOW   APPearance CLEAR CLEAR   Specific Gravity, Urine >1.030 (H) 1.005 - 1.030   pH 6.0 5.0 - 8.0   Glucose, UA NEGATIVE NEGATIVE mg/dL   Hgb urine dipstick NEGATIVE NEGATIVE   Bilirubin Urine NEGATIVE NEGATIVE   Ketones, ur NEGATIVE NEGATIVE mg/dL   Protein, ur 30 (A) NEGATIVE mg/dL   Nitrite NEGATIVE NEGATIVE   Leukocytes, UA NEGATIVE NEGATIVE  Urine microscopic-add on     Status: Abnormal   Collection Time: 12/08/15  7:17 PM  Result Value Ref Range   Squamous Epithelial / LPF 0-5 (A) NONE SEEN   WBC, UA 0-5 0 - 5 WBC/hpf   RBC / HPF 0-5 0 - 5 RBC/hpf   Bacteria, UA RARE (A) NONE SEEN   Crystals CA OXALATE CRYSTALS (A) NEGATIVE   Urine-Other MUCOUS PRESENT      MDM  20 y.o. female with dysuria, frequency and urgency that started today and feels similar to UTI's in the past stable for d/c without acute abdomen, fever or signs of pyelo. Urine sent for culture and will start  Macrobid and Pyridium. She will f/u with her PCP or return here as needed. Discussed with the patient and all questioned fully answered. She voices understanding and agrees with plan.   Final diagnoses:  UTI symptoms   I personally performed the services described in this documentation, which was scribed in my presence. The recorded information has been reviewed and is accurate.      [address]Hope WeldonM Eupha Lobb, [place]NP 12/08/15 2041  Raeford RazorStephen Kohut, MD 12/11/15 [phone]

## 2015-12-08 NOTE — Discharge Instructions (Signed)
If we need to change your medication when the culture comes back someone will call you

## 2015-12-10 LAB — URINE CULTURE: CULTURE: NO GROWTH

## 2016-02-12 ENCOUNTER — Ambulatory Visit: Payer: Medicaid Other

## 2016-05-23 ENCOUNTER — Encounter (HOSPITAL_COMMUNITY): Payer: Self-pay | Admitting: *Deleted

## 2016-05-23 ENCOUNTER — Emergency Department (HOSPITAL_COMMUNITY)
Admission: EM | Admit: 2016-05-23 | Discharge: 2016-05-24 | Disposition: A | Discharge status: Home / Self Care | Payer: Medicaid Other | Attending: Emergency Medicine | Admitting: Emergency Medicine

## 2016-05-23 DIAGNOSIS — Z791 Long term (current) use of non-steroidal anti-inflammatories (NSAID): Secondary | ICD-10-CM | POA: Diagnosis not present

## 2016-05-23 DIAGNOSIS — R300 Dysuria: Secondary | ICD-10-CM | POA: Diagnosis present

## 2016-05-23 DIAGNOSIS — Z79899 Other long term (current) drug therapy: Secondary | ICD-10-CM | POA: Diagnosis not present

## 2016-05-23 DIAGNOSIS — Z87891 Personal history of nicotine dependence: Secondary | ICD-10-CM | POA: Insufficient documentation

## 2016-05-23 DIAGNOSIS — N390 Urinary tract infection, site not specified: Secondary | ICD-10-CM | POA: Diagnosis not present

## 2016-05-23 LAB — URINALYSIS, ROUTINE W REFLEX MICROSCOPIC
Bilirubin Urine: NEGATIVE
GLUCOSE, UA: NEGATIVE mg/dL
Ketones, ur: NEGATIVE mg/dL
Nitrite: NEGATIVE
PROTEIN: NEGATIVE mg/dL
Specific Gravity, Urine: 1.025 (ref 1.005–1.030)
pH: 5.5 (ref 5.0–8.0)

## 2016-05-23 LAB — URINE MICROSCOPIC-ADD ON

## 2016-05-23 LAB — WET PREP, GENITAL
Clue Cells Wet Prep HPF POC: NONE SEEN
Sperm: NONE SEEN
Trich, Wet Prep: NONE SEEN
Yeast Wet Prep HPF POC: NONE SEEN

## 2016-05-23 LAB — PREGNANCY, URINE: Preg Test, Ur: NEGATIVE

## 2016-05-23 MED ORDER — CEPHALEXIN 500 MG PO CAPS: 500.0000 mg | ORAL_CAPSULE | Freq: Four times a day (QID) | ORAL | 0 refills | Status: DC

## 2016-05-23 MED ORDER — CEPHALEXIN 500 MG PO CAPS
500.0000 mg | ORAL_CAPSULE | Freq: Once | ORAL | Status: AC
  Administered 2016-05-24: 500 mg via ORAL
  Filled 2016-05-23: qty 1

## 2016-05-23 NOTE — ED Provider Notes (Signed)
[facility]-EMERGENCY DEPT Provider Note   CSN: [id number] Arrival date & time: 05/23/16  1926     History   Chief Complaint Chief Complaint  Patient presents with  . Dysuria    HPI [name] [name] is a 20 y.o. female.  HPI  Pt was seen at 2125. Per pt, c/o gradual onset and persistence of constant dysuria, urinary frequency and urgency for the past 1 week.  Denies flank pain, no fevers, no abd pain, no N/V/D, no vaginal bleeding/discharge, no rash.   Past Medical History:  Diagnosis Date  . Medical history non-contributory   . Postpartum depression 02/19/2014    Patient Active Problem List   Diagnosis Date Noted  . Postpartum depression 02/19/2014  . Marijuana use 09/05/2013  . Susceptible to varicella (non-immune), currently pregnant 08/15/2013    Past Surgical History:  Procedure Laterality Date  . TONSILLECTOMY      OB History    Gravida Para Term Preterm AB Living   1 1 1     1    SAB TAB Ectopic Multiple Live Births           1       Home Medications    Prior to Admission medications   Medication Sig Start Date End Date Taking? Authorizing Provider  ibuprofen (ADVIL,MOTRIN) 200 MG tablet Take 400 mg by mouth every 6 (six) hours as needed for headache.   Yes Historical Provider, MD  medroxyPROGESTERone (DEPO-PROVERA) 150 MG/ML injection INJECT 1 ML INTO THE MUSCLE EVERY 3 MONTHS Patient not taking: Reported on 05/23/2016 05/28/15   Jacklyn Shell, CNM    Family History Family History  Problem Relation Age of Onset  . Cancer Maternal Grandfather   . Alcohol abuse Neg Hx   . Arthritis Neg Hx   . Asthma Neg Hx   . COPD Neg Hx   . Birth defects Neg Hx   . Depression Neg Hx   . Diabetes Neg Hx   . Drug abuse Neg Hx   . Early death Neg Hx   . Hearing loss Neg Hx   . Heart disease Neg Hx   . Hyperlipidemia Neg Hx   . Hypertension Neg Hx   . Kidney disease Neg Hx   . Learning disabilities Neg Hx   . Mental illness Neg Hx   .  Miscarriages / Stillbirths Neg Hx   . Mental retardation Neg Hx   . Vision loss Neg Hx   . Stroke Neg Hx   . Varicose Veins Neg Hx     Social History Social History  Substance Use Topics  . Smoking status: Former Smoker    Packs/day: 0.50    Years: 2.00    Types: Cigarettes    Quit date: 03/10/2016  . Smokeless tobacco: Never Used  . Alcohol use No     Allergies   Review of patient's allergies indicates no known allergies.   Review of Systems Review of Systems ROS: Statement: All systems negative except as marked or noted in the HPI; Constitutional: Negative for fever and chills. ; ; Eyes: Negative for eye pain, redness and discharge. ; ; ENMT: Negative for ear pain, hoarseness, nasal congestion, sinus pressure and sore throat. ; ; Cardiovascular: Negative for chest pain, palpitations, diaphoresis, dyspnea and peripheral edema. ; ; Respiratory: Negative for cough, wheezing and stridor. ; ; Gastrointestinal: Negative for nausea, vomiting, diarrhea, abdominal pain, blood in stool, hematemesis, jaundice and rectal bleeding. . ; ; Genitourinary: +dysuria. Negative for flank pain and hematuria. ; ;  GYN:  No pelvic pain, no vaginal bleeding, no vaginal discharge, no vulvar pain. ;; Musculoskeletal: Negative for back pain and neck pain. Negative for swelling and trauma.; ; Skin: Negative for pruritus, rash, abrasions, blisters, bruising and skin lesion.; ; Neuro: Negative for headache, lightheadedness and neck stiffness. Negative for weakness, altered level of consciousness, altered mental status, extremity weakness, paresthesias, involuntary movement, seizure and syncope.       Physical Exam Updated Vital Signs BP 130/58 (BP Location: Right Arm)   Pulse 82   Temp 97.8 F (36.6 C) (Oral)   Resp 18   Ht 5\' 5"  (1.651 m)   Wt 104 lb (47.2 kg)   LMP 05/01/2016   SpO2 100%   BMI 17.31 kg/m   Physical Exam 2130: Physical examination:  Nursing notes reviewed; Vital signs and O2 SAT  reviewed;  Constitutional: Well developed, Well nourished, Well hydrated, In no acute distress; Head:  Normocephalic, atraumatic; Eyes: EOMI, PERRL, No scleral icterus; ENMT: Mouth and pharynx normal, Mucous membranes moist; Neck: Supple, Full range of motion, No lymphadenopathy; Cardiovascular: Regular rate and rhythm, No murmur, rub, or gallop; Respiratory: Breath sounds clear & equal bilaterally, No rales, rhonchi, wheezes.  Speaking full sentences with ease, Normal respiratory effort/excursion; Chest: Nontender, Movement normal; Abdomen: Soft, Nontender, Nondistended, Normal bowel sounds; Genitourinary: No CVA tenderness. Pelvic exam performed with permission of pt and female ED tech assist during exam.  External genitalia w/o lesions. Vaginal vault without discharge.  Cervix w/o lesions, not friable, GC/chlam and wet prep obtained and sent to lab.  Bimanual exam w/o CMT or adnexal tenderness. +suprapubic tenderness.; Extremities: Pulses normal, No tenderness, No edema, No calf edema or asymmetry.; Neuro: AA&Ox3, Major CN grossly intact.  Speech clear. No gross focal motor or sensory deficits in extremities.; Skin: Color normal, Warm, Dry.   ED Treatments / Results  Labs (all labs ordered are listed, but only abnormal results are displayed)   EKG  EKG Interpretation None       Radiology   Procedures Procedures (including critical care time)  Medications Ordered in ED Medications - No data to display   Initial Impression / Assessment and Plan / ED Course  I have reviewed the triage vital signs and the nursing notes.  Pertinent labs & imaging results that were available during my care of the patient were reviewed by me and considered in my medical decision making (see chart for details).  MDM Reviewed: previous chart, nursing note and vitals Reviewed previous: labs Interpretation: labs   Results for orders placed or performed during the hospital encounter of 05/23/16  Wet prep,  genital  Result Value Ref Range   Yeast Wet Prep HPF POC NONE SEEN NONE SEEN   Trich, Wet Prep NONE SEEN NONE SEEN   Clue Cells Wet Prep HPF POC NONE SEEN NONE SEEN   WBC, Wet Prep HPF POC RARE (A) NONE SEEN   Sperm NONE SEEN   Pregnancy, urine  Result Value Ref Range   Preg Test, Ur NEGATIVE NEGATIVE  Urinalysis, Routine w reflex microscopic (not at [facility])  Result Value Ref Range   Color, Urine YELLOW YELLOW   APPearance CLEAR CLEAR   Specific Gravity, Urine 1.025 1.005 - 1.030   pH 5.5 5.0 - 8.0   Glucose, UA NEGATIVE NEGATIVE mg/dL   Hgb urine dipstick TRACE (A) NEGATIVE   Bilirubin Urine NEGATIVE NEGATIVE   Ketones, ur NEGATIVE NEGATIVE mg/dL   Protein, ur NEGATIVE NEGATIVE mg/dL   Nitrite NEGATIVE NEGATIVE  Leukocytes, UA TRACE (A) NEGATIVE  Urine microscopic-add on  Result Value Ref Range   Squamous Epithelial / LPF 0-5 (A) NONE SEEN   WBC, UA 6-30 0 - 5 WBC/hpf   RBC / HPF 0-5 0 - 5 RBC/hpf   Bacteria, UA FEW (A) NONE SEEN    2345:  Will tx for UTI. Dx and testing d/w pt.  Questions answered.  Verb understanding, agreeable to d/c home with outpt f/u.    Final Clinical Impressions(s) / ED Diagnoses   Final diagnoses:  None    New Prescriptions New Prescriptions   No medications on file     Samuel JesterKathleen Maliik Karner, DO 05/26/16 1947

## 2016-05-23 NOTE — Discharge Instructions (Signed)
Take the prescription as directed.  Call your regular medical doctor on Monday to schedule a follow up appointment within the next week.  Return to the Emergency Department immediately sooner if worsening.  ° °

## 2016-05-23 NOTE — ED Triage Notes (Signed)
Pt reports dysuria and vaginal burning and swelling. Pt states she just went off of her depo shot and has had her period for at least 3 weeks.

## 2016-05-24 NOTE — ED Notes (Signed)
Pt alert & oriented x4, stable gait. Patient given discharge instructions, paperwork & prescription(s). Patient  instructed to stop at the registration desk to finish any additional paperwork. Patient verbalized understanding. Pt left department w/ no further questions. 

## 2016-05-25 LAB — GC/CHLAMYDIA PROBE AMP (~~LOC~~) NOT AT ARMC
Chlamydia: NEGATIVE
Neisseria Gonorrhea: NEGATIVE

## 2017-04-27 ENCOUNTER — Ambulatory Visit (INDEPENDENT_AMBULATORY_CARE_PROVIDER_SITE_OTHER): Payer: Medicaid Other | Admitting: Adult Health

## 2017-04-27 ENCOUNTER — Encounter: Payer: Self-pay | Admitting: Adult Health

## 2017-04-27 VITALS — BP 100/60 | HR 96 | Ht 65.0 in | Wt 98.0 lb

## 2017-04-27 DIAGNOSIS — N926 Irregular menstruation, unspecified: Secondary | ICD-10-CM

## 2017-04-27 DIAGNOSIS — Z3201 Encounter for pregnancy test, result positive: Secondary | ICD-10-CM | POA: Diagnosis not present

## 2017-04-27 DIAGNOSIS — R110 Nausea: Secondary | ICD-10-CM

## 2017-04-27 DIAGNOSIS — O3680X0 Pregnancy with inconclusive fetal viability, not applicable or unspecified: Secondary | ICD-10-CM

## 2017-04-27 DIAGNOSIS — Z3A10 10 weeks gestation of pregnancy: Secondary | ICD-10-CM

## 2017-04-27 LAB — POCT URINE PREGNANCY: Preg Test, Ur: POSITIVE — AB

## 2017-04-27 MED ORDER — PRENATAL PLUS 27-1 MG PO TABS: 1.0000 | ORAL_TABLET | Freq: Every day | ORAL | 12 refills | Status: DC

## 2017-04-27 NOTE — Patient Instructions (Signed)
First Trimester of Pregnancy The first trimester of pregnancy is from week 1 until the end of week 13 (months 1 through 3). A week after a sperm fertilizes an egg, the egg will implant on the wall of the uterus. This embryo will begin to develop into a baby. Genes from you and your partner will form the baby. The female genes will determine whether the baby will be a boy or a girl. At 6-8 weeks, the eyes and face will be formed, and the heartbeat can be seen on ultrasound. At the end of 12 weeks, all the baby's organs will be formed. Now that you are pregnant, you will want to do everything you can to have a healthy baby. Two of the most important things are to get good prenatal care and to follow your health care provider's instructions. Prenatal care is all the medical care you receive before the baby's birth. This care will help prevent, find, and treat any problems during the pregnancy and childbirth. Body changes during your first trimester Your body goes through many changes during pregnancy. The changes vary from woman to woman.  You may gain or lose a couple of pounds at first.  You may feel sick to your stomach (nauseous) and you may throw up (vomit). If the vomiting is uncontrollable, call your health care provider.  You may tire easily.  You may develop headaches that can be relieved by medicines. All medicines should be approved by your health care provider.  You may urinate more often. Painful urination may mean you have a bladder infection.  You may develop heartburn as a result of your pregnancy.  You may develop constipation because certain hormones are causing the muscles that push stool through your intestines to slow down.  You may develop hemorrhoids or swollen veins (varicose veins).  Your breasts may begin to grow larger and become tender. Your nipples may stick out more, and the tissue that surrounds them (areola) may become darker.  Your gums may bleed and may be  sensitive to brushing and flossing.  Dark spots or blotches (chloasma, mask of pregnancy) may develop on your face. This will likely fade after the baby is born.  Your menstrual periods will stop.  You may have a loss of appetite.  You may develop cravings for certain kinds of food.  You may have changes in your emotions from day to day, such as being excited to be pregnant or being concerned that something may go wrong with the pregnancy and baby.  You may have more vivid and strange dreams.  You may have changes in your hair. These can include thickening of your hair, rapid growth, and changes in texture. Some women also have hair loss during or after pregnancy, or hair that feels dry or thin. Your hair will most likely return to normal after your baby is born.  What to expect at prenatal visits During a routine prenatal visit:  You will be weighed to make sure you and the baby are growing normally.  Your blood pressure will be taken.  Your abdomen will be measured to track your baby's growth.  The fetal heartbeat will be listened to between weeks 10 and 14 of your pregnancy.  Test results from any previous visits will be discussed.  Your health care provider may ask you:  How you are feeling.  If you are feeling the baby move.  If you have had any abnormal symptoms, such as leaking fluid, bleeding, severe headaches,   or abdominal cramping.  If you are using any tobacco products, including cigarettes, chewing tobacco, and electronic cigarettes.  If you have any questions.  Other tests that may be performed during your first trimester include:  Blood tests to find your blood type and to check for the presence of any previous infections. The tests will also be used to check for low iron levels (anemia) and protein on red blood cells (Rh antibodies). Depending on your risk factors, or if you previously had diabetes during pregnancy, you may have tests to check for high blood  sugar that affects pregnant women (gestational diabetes).  Urine tests to check for infections, diabetes, or protein in the urine.  An ultrasound to confirm the proper growth and development of the baby.  Fetal screens for spinal cord problems (spina bifida) and Down syndrome.  HIV (human immunodeficiency virus) testing. Routine prenatal testing includes screening for HIV, unless you choose not to have this test.  You may need other tests to make sure you and the baby are doing well.  Follow these instructions at home: Medicines  Follow your health care provider's instructions regarding medicine use. Specific medicines may be either safe or unsafe to take during pregnancy.  Take a prenatal vitamin that contains at least 600 micrograms (mcg) of folic acid.  If you develop constipation, try taking a stool softener if your health care provider approves. Eating and drinking  Eat a balanced diet that includes fresh fruits and vegetables, whole grains, good sources of protein such as meat, eggs, or tofu, and low-fat dairy. Your health care provider will help you determine the amount of weight gain that is right for you.  Avoid raw meat and uncooked cheese. These carry germs that can cause birth defects in the baby.  Eating four or five small meals rather than three large meals a day may help relieve nausea and vomiting. If you start to feel nauseous, eating a few soda crackers can be helpful. Drinking liquids between meals, instead of during meals, also seems to help ease nausea and vomiting.  Limit foods that are high in fat and processed sugars, such as fried and sweet foods.  To prevent constipation: ? Eat foods that are high in fiber, such as fresh fruits and vegetables, whole grains, and beans. ? Drink enough fluid to keep your urine clear or pale yellow. Activity  Exercise only as directed by your health care provider. Most women can continue their usual exercise routine during  pregnancy. Try to exercise for 30 minutes at least 5 days a week. Exercising will help you: ? Control your weight. ? Stay in shape. ? Be prepared for labor and delivery.  Experiencing pain or cramping in the lower abdomen or lower back is a good sign that you should stop exercising. Check with your health care provider before continuing with normal exercises.  Try to avoid standing for long periods of time. Move your legs often if you must stand in one place for a long time.  Avoid heavy lifting.  Wear low-heeled shoes and practice good posture.  You may continue to have sex unless your health care provider tells you not to. Relieving pain and discomfort  Wear a good support bra to relieve breast tenderness.  Take warm sitz baths to soothe any pain or discomfort caused by hemorrhoids. Use hemorrhoid cream if your health care provider approves.  Rest with your legs elevated if you have leg cramps or low back pain.  If you develop   varicose veins in your legs, wear support hose. Elevate your feet for 15 minutes, 3-4 times a day. Limit salt in your diet. Prenatal care  Schedule your prenatal visits by the twelfth week of pregnancy. They are usually scheduled monthly at first, then more often in the last 2 months before delivery.  Write down your questions. Take them to your prenatal visits.  Keep all your prenatal visits as told by your health care provider. This is important. Safety  Wear your seat belt at all times when driving.  Make a list of emergency phone numbers, including numbers for family, friends, the hospital, and police and fire departments. General instructions  Ask your health care provider for a referral to a local prenatal education class. Begin classes no later than the beginning of month 6 of your pregnancy.  Ask for help if you have counseling or nutritional needs during pregnancy. Your health care provider can offer advice or refer you to specialists for help  with various needs.  Do not use hot tubs, steam rooms, or saunas.  Do not douche or use tampons or scented sanitary pads.  Do not cross your legs for long periods of time.  Avoid cat litter boxes and soil used by cats. These carry germs that can cause birth defects in the baby and possibly loss of the fetus by miscarriage or stillbirth.  Avoid all smoking, herbs, alcohol, and medicines not prescribed by your health care provider. Chemicals in these products affect the formation and growth of the baby.  Do not use any products that contain nicotine or tobacco, such as cigarettes and e-cigarettes. If you need help quitting, ask your health care provider. You may receive counseling support and other resources to help you quit.  Schedule a dentist appointment. At home, brush your teeth with a soft toothbrush and be gentle when you floss. Contact a health care provider if:  You have dizziness.  You have mild pelvic cramps, pelvic pressure, or nagging pain in the abdominal area.  You have persistent nausea, vomiting, or diarrhea.  You have a bad smelling vaginal discharge.  You have pain when you urinate.  You notice increased swelling in your face, hands, legs, or ankles.  You are exposed to fifth disease or chickenpox.  You are exposed to German measles (rubella) and have never had it. Get help right away if:  You have a fever.  You are leaking fluid from your vagina.  You have spotting or bleeding from your vagina.  You have severe abdominal cramping or pain.  You have rapid weight gain or loss.  You vomit blood or material that looks like coffee grounds.  You develop a severe headache.  You have shortness of breath.  You have any kind of trauma, such as from a fall or a car accident. Summary  The first trimester of pregnancy is from week 1 until the end of week 13 (months 1 through 3).  Your body goes through many changes during pregnancy. The changes vary from  woman to woman.  You will have routine prenatal visits. During those visits, your health care provider will examine you, discuss any test results you may have, and talk with you about how you are feeling. This information is not intended to replace advice given to you by your health care provider. Make sure you discuss any questions you have with your health care provider. Document Released: 07/21/2001 Document Revised: 07/08/2016 Document Reviewed: 07/08/2016 Elsevier Interactive Patient Education  2017 [employer]  

## 2017-04-27 NOTE — Progress Notes (Signed)
Subjective:     Patient ID: [name] [name], female   DOB: Feb 26, 1996, 21 y.o.   MRN: [id number]  HPI [name] [name] is a 21 year old white female in for UPT, she has missed a period and had +HPT and has nausea.   Review of Systems Missed period with +HPT +nausea Reviewed past medical,surgical, social and family history. Reviewed medications and allergies.     Objective:   Physical Exam BP 100/60 (BP Location: Left Arm, Patient Position: Sitting, Cuff Size: Small)   Pulse 96   Ht  (1.651 m)   Wt 98 lb (44.5 kg)   LMP 02/12/2017   BMI 16.31 kg/m UPT +, about 10+4 weeks by LMP with EDD 11/19/17.Skin warm and dry. Neck: mid line trachea, normal thyroid, good ROM, no lymphadenopathy noted. Lungs: clear to ausculation bilaterally. Cardiovascular: regular rate and rhythm.Abdomen is soft and non tender. PHQ 2 score 0. Pt aware that babies delivered at [facility] and about after hours call service.     Assessment:     1. Pregnancy test positive   2. [redacted] weeks gestation of pregnancy   3. Encounter to determine fetal viability of pregnancy, single or unspecified fetus       Plan:    Eat often  Meds ordered this encounter  Medications  . prenatal vitamin w/FE, FA (PRENATAL 1 + 1) 27-1 MG TABS tablet    Sig: Take 1 tablet by mouth daily at 12 noon.    Dispense:  30 each    Refill:  12    Order Specific Question:   Supervising Provider    Answer:   Lazaro Arms [[id number]]  Return in 1 week for dating [place] Review handout on first trimester and by [facility]

## 2017-05-05 ENCOUNTER — Ambulatory Visit (INDEPENDENT_AMBULATORY_CARE_PROVIDER_SITE_OTHER): Payer: Medicaid Other

## 2017-05-05 DIAGNOSIS — Z3A08 8 weeks gestation of pregnancy: Secondary | ICD-10-CM

## 2017-05-05 DIAGNOSIS — O3680X0 Pregnancy with inconclusive fetal viability, not applicable or unspecified: Secondary | ICD-10-CM

## 2017-05-05 NOTE — Progress Notes (Signed)
[place] 8+2 wks,single IUP w/ys,pos fht 166 bpm,normal ovaries bilat,crl 18.37 mm,subchorionic hemorrhage 2.3 x 1.9 x .9 cm,EDD 12/13/2017 by [place]

## 2017-05-13 ENCOUNTER — Encounter: Payer: Self-pay | Admitting: Advanced Practice Midwife

## 2017-05-13 ENCOUNTER — Ambulatory Visit (INDEPENDENT_AMBULATORY_CARE_PROVIDER_SITE_OTHER): Payer: Medicaid Other | Admitting: Advanced Practice Midwife

## 2017-05-13 ENCOUNTER — Ambulatory Visit: Payer: Medicaid Other | Admitting: *Deleted

## 2017-05-13 VITALS — BP 80/58 | HR 80 | Wt 100.0 lb

## 2017-05-13 DIAGNOSIS — Z2839 Other underimmunization status: Secondary | ICD-10-CM

## 2017-05-13 DIAGNOSIS — Z3490 Encounter for supervision of normal pregnancy, unspecified, unspecified trimester: Secondary | ICD-10-CM | POA: Insufficient documentation

## 2017-05-13 DIAGNOSIS — Z1389 Encounter for screening for other disorder: Secondary | ICD-10-CM

## 2017-05-13 DIAGNOSIS — Z283 Underimmunization status: Secondary | ICD-10-CM

## 2017-05-13 DIAGNOSIS — Z3A09 9 weeks gestation of pregnancy: Secondary | ICD-10-CM | POA: Diagnosis not present

## 2017-05-13 DIAGNOSIS — Z3481 Encounter for supervision of other normal pregnancy, first trimester: Secondary | ICD-10-CM

## 2017-05-13 DIAGNOSIS — O99321 Drug use complicating pregnancy, first trimester: Secondary | ICD-10-CM | POA: Diagnosis not present

## 2017-05-13 DIAGNOSIS — O09899 Supervision of other high risk pregnancies, unspecified trimester: Secondary | ICD-10-CM

## 2017-05-13 DIAGNOSIS — Z331 Pregnant state, incidental: Secondary | ICD-10-CM

## 2017-05-13 DIAGNOSIS — Z8759 Personal history of other complications of pregnancy, childbirth and the puerperium: Secondary | ICD-10-CM

## 2017-05-13 DIAGNOSIS — Z3682 Encounter for antenatal screening for nuchal translucency: Secondary | ICD-10-CM

## 2017-05-13 DIAGNOSIS — F1290 Cannabis use, unspecified, uncomplicated: Secondary | ICD-10-CM

## 2017-05-13 NOTE — Progress Notes (Signed)
Subjective:    [name] [name] is a G2P1001 [redacted]w[redacted]d being seen today for her first obstetrical visit.  Her obstetrical history is significant for HX IUGR (6# 3 oz @ 39 weeks_.  Pregnancy history fully reviewed.  Patient reports no complaints.  Vitals:   05/13/17 1347  BP: (!) 80/58  Pulse: 80  Weight: 100 lb (45.4 kg)    HISTORY: OB History  Gravida Para Term Preterm AB Living  SAB TAB Ectopic Multiple Live Births          1    # Outcome Date GA Lbr Len/2nd Weight Sex Delivery Anes PTL Lv  2 Current           1 Term 02/13/14 [redacted]w[redacted]d 10:40 / 00:17 6 lb 3 oz (2.807 kg) F Vag-Spont EPI N LIV     Birth Comments: None     Past Medical History:  Diagnosis Date  . Postpartum depression 02/19/2014   Past Surgical History:  Procedure Laterality Date  . MYRINGOTOMY    . TONSILLECTOMY     Family History  Problem Relation Age of Onset  . Cancer Maternal Grandfather        stomach, throat  . Diabetes Cousin   . Alcohol abuse Neg Hx   . Arthritis Neg Hx   . Asthma Neg Hx   . COPD Neg Hx   . Birth defects Neg Hx   . Depression Neg Hx   . Drug abuse Neg Hx   . Early death Neg Hx   . Hearing loss Neg Hx   . Heart disease Neg Hx   . Hyperlipidemia Neg Hx   . Hypertension Neg Hx   . Kidney disease Neg Hx   . Learning disabilities Neg Hx   . Mental illness Neg Hx   . Miscarriages / Stillbirths Neg Hx   . Mental retardation Neg Hx   . Vision loss Neg Hx   . Stroke Neg Hx   . Varicose Veins Neg Hx      Exam                                      System:     Skin: normal coloration and turgor, no rashes    Neurologic: oriented, normal, normal mood   Extremities: normal strength, tone, and muscle mass   HEENT PERRLA   Mouth/Teeth mucous membranes moist, normal dentition   Neck supple and no masses   Cardiovascular: regular rate and rhythm   Respiratory:  appears well, vitals normal, no respiratory distress, acyanotic   Abdomen: soft, non-tender;   FHR: 160 [place]        The nature of [facility] with multiple MDs and other Advanced Practice Providers was explained to patient; also emphasized that residents, students are part of our team.  Assessment:    Pregnancy: G2P1001 Patient Active Problem List   Diagnosis Date Noted  . Supervision of normal pregnancy 05/13/2017  . Postpartum depression 02/19/2014  . Marijuana use 09/05/2013  . Susceptible to varicella (non-immune), currently pregnant 08/15/2013        Plan:     Initial labs drawn. Continue prenatal vitamins  Problem list reviewed and updated  Reviewed n/v relief measures and warning s/s to report  Reviewed recommended weight gain based on pre-gravid BMI  Encouraged well-balanced diet Genetic Screening  discussed Integrated Screen: requested.  Ultrasound discussed; fetal survey: requested.  Return in about 3 weeks (around 06/03/2017) for LROB, US:NT+1st IT.  CRESENZO-DISHMAN,Niraj Kudrna 05/13/2017

## 2017-05-13 NOTE — Patient Instructions (Signed)
 First Trimester of Pregnancy The first trimester of pregnancy is from week 1 until the end of week 12 (months 1 through 3). A week after a sperm fertilizes an egg, the egg will implant on the wall of the uterus. This embryo will begin to develop into a baby. Genes from you and your partner are forming the baby. The female genes determine whether the baby is a boy or a girl. At 6-8 weeks, the eyes and face are formed, and the heartbeat can be seen on ultrasound. At the end of 12 weeks, all the baby's organs are formed.  Now that you are pregnant, you will want to do everything you can to have a healthy baby. Two of the most important things are to get good prenatal care and to follow your health care provider's instructions. Prenatal care is all the medical care you receive before the baby's birth. This care will help prevent, find, and treat any problems during the pregnancy and childbirth. BODY CHANGES Your body goes through many changes during pregnancy. The changes vary from woman to woman.   You may gain or lose a couple of pounds at first.  You may feel sick to your stomach (nauseous) and throw up (vomit). If the vomiting is uncontrollable, call your health care provider.  You may tire easily.  You may develop headaches that can be relieved by medicines approved by your health care provider.  You may urinate more often. Painful urination may mean you have a bladder infection.  You may develop heartburn as a result of your pregnancy.  You may develop constipation because certain hormones are causing the muscles that push waste through your intestines to slow down.  You may develop hemorrhoids or swollen, bulging veins (varicose veins).  Your breasts may begin to grow larger and become tender. Your nipples may stick out more, and the tissue that surrounds them (areola) may become darker.  Your gums may bleed and may be sensitive to brushing and flossing.  Dark spots or blotches  (chloasma, mask of pregnancy) may develop on your face. This will likely fade after the baby is born.  Your menstrual periods will stop.  You may have a loss of appetite.  You may develop cravings for certain kinds of food.  You may have changes in your emotions from day to day, such as being excited to be pregnant or being concerned that something may go wrong with the pregnancy and baby.  You may have more vivid and strange dreams.  You may have changes in your hair. These can include thickening of your hair, rapid growth, and changes in texture. Some women also have hair loss during or after pregnancy, or hair that feels dry or thin. Your hair will most likely return to normal after your baby is born. WHAT TO EXPECT AT YOUR PRENATAL VISITS During a routine prenatal visit:  You will be weighed to make sure you and the baby are growing normally.  Your blood pressure will be taken.  Your abdomen will be measured to track your baby's growth.  The fetal heartbeat will be listened to starting around week 10 or 12 of your pregnancy.  Test results from any previous visits will be discussed. Your health care provider may ask you:  How you are feeling.  If you are feeling the baby move.  If you have had any abnormal symptoms, such as leaking fluid, bleeding, severe headaches, or abdominal cramping.  If you have any questions. Other   tests that may be performed during your first trimester include:  Blood tests to find your blood type and to check for the presence of any previous infections. They will also be used to check for low iron levels (anemia) and Rh antibodies. Later in the pregnancy, blood tests for diabetes will be done along with other tests if problems develop.  Urine tests to check for infections, diabetes, or protein in the urine.  An ultrasound to confirm the proper growth and development of the baby.  An amniocentesis to check for possible genetic problems.  Fetal  screens for spina bifida and Down syndrome.  You may need other tests to make sure you and the baby are doing well. HOME CARE INSTRUCTIONS  Medicines  Follow your health care provider's instructions regarding medicine use. Specific medicines may be either safe or unsafe to take during pregnancy.  Take your prenatal vitamins as directed.  If you develop constipation, try taking a stool softener if your health care provider approves. Diet  Eat regular, well-balanced meals. Choose a variety of foods, such as meat or vegetable-based protein, fish, milk and low-fat dairy products, vegetables, fruits, and whole grain breads and cereals. Your health care provider will help you determine the amount of weight gain that is right for you.  Avoid raw meat and uncooked cheese. These carry germs that can cause birth defects in the baby.  Eating four or five small meals rather than three large meals a day may help relieve nausea and vomiting. If you start to feel nauseous, eating a few soda crackers can be helpful. Drinking liquids between meals instead of during meals also seems to help nausea and vomiting.  If you develop constipation, eat more high-fiber foods, such as fresh vegetables or fruit and whole grains. Drink enough fluids to keep your urine clear or pale yellow. Activity and Exercise  Exercise only as directed by your health care provider. Exercising will help you:  Control your weight.  Stay in shape.  Be prepared for labor and delivery.  Experiencing pain or cramping in the lower abdomen or low back is a good sign that you should stop exercising. Check with your health care provider before continuing normal exercises.  Try to avoid standing for long periods of time. Move your legs often if you must stand in one place for a long time.  Avoid heavy lifting.  Wear low-heeled shoes, and practice good posture.  You may continue to have sex unless your health care provider directs you  otherwise. Relief of Pain or Discomfort  Wear a good support bra for breast tenderness.   Take warm sitz baths to soothe any pain or discomfort caused by hemorrhoids. Use hemorrhoid cream if your health care provider approves.   Rest with your legs elevated if you have leg cramps or low back pain.  If you develop varicose veins in your legs, wear support hose. Elevate your feet for 15 minutes, 3-4 times a day. Limit salt in your diet. Prenatal Care  Schedule your prenatal visits by the twelfth week of pregnancy. They are usually scheduled monthly at first, then more often in the last 2 months before delivery.  Write down your questions. Take them to your prenatal visits.  Keep all your prenatal visits as directed by your health care provider. Safety  Wear your seat belt at all times when driving.  Make a list of emergency phone numbers, including numbers for family, friends, the hospital, and police and fire departments. General   Tips  Ask your health care provider for a referral to a local prenatal education class. Begin classes no later than at the beginning of month 6 of your pregnancy.  Ask for help if you have counseling or nutritional needs during pregnancy. Your health care provider can offer advice or refer you to specialists for help with various needs.  Do not use hot tubs, steam rooms, or saunas.  Do not douche or use tampons or scented sanitary pads.  Do not cross your legs for long periods of time.  Avoid cat litter boxes and soil used by cats. These carry germs that can cause birth defects in the baby and possibly loss of the fetus by miscarriage or stillbirth.  Avoid all smoking, herbs, alcohol, and medicines not prescribed by your health care provider. Chemicals in these affect the formation and growth of the baby.  Schedule a dentist appointment. At home, brush your teeth with a soft toothbrush and be gentle when you floss. SEEK MEDICAL CARE IF:   You have  dizziness.  You have mild pelvic cramps, pelvic pressure, or nagging pain in the abdominal area.  You have persistent nausea, vomiting, or diarrhea.  You have a bad smelling vaginal discharge.  You have pain with urination.  You notice increased swelling in your face, hands, legs, or ankles. SEEK IMMEDIATE MEDICAL CARE IF:   You have a fever.  You are leaking fluid from your vagina.  You have spotting or bleeding from your vagina.  You have severe abdominal cramping or pain.  You have rapid weight gain or loss.  You vomit blood or material that looks like coffee grounds.  You are exposed to German measles and have never had them.  You are exposed to fifth disease or chickenpox.  You develop a severe headache.  You have shortness of breath.  You have any kind of trauma, such as from a fall or a car accident. Document Released: 07/21/2001 Document Revised: 12/11/2013 Document Reviewed: 06/06/2013 [employer] Patient Information 2015 [employer]. This information is not intended to replace advice given to you by your health care provider. Make sure you discuss any questions you have with your health care provider.   Nausea & Vomiting  Have saltine crackers or pretzels by your bed and eat a few bites before you raise your head out of bed in the morning  Eat small frequent meals throughout the day instead of large meals  Drink plenty of fluids throughout the day to stay hydrated, just don't drink a lot of fluids with your meals.  This can make your stomach fill up faster making you feel sick  Do not brush your teeth right after you eat  Products with real ginger are good for nausea, like ginger ale and ginger hard candy Make sure it says made with real ginger!  Sucking on sour candy like lemon heads is also good for nausea  If your prenatal vitamins make you nauseated, take them at night so you will sleep through the nausea  Sea Bands  If you feel like you need  medicine for the nausea & vomiting please let us know  If you are unable to keep any fluids or food down please let us know   Constipation  Drink plenty of fluid, preferably water, throughout the day  Eat foods high in fiber such as fruits, vegetables, and grains  Exercise, such as walking, is a good way to keep your bowels regular  Drink warm fluids, especially warm   prune juice, or decaf coffee  Eat a 1/2 cup of real oatmeal (not instant), 1/2 cup applesauce, and 1/2-1 cup warm prune juice every day  If needed, you may take Colace (docusate sodium) stool softener once or twice a day to help keep the stool soft. If you are pregnant, wait until you are out of your first trimester (12-14 weeks of pregnancy)  If you still are having problems with constipation, you may take Miralax once daily as needed to help keep your bowels regular.  If you are pregnant, wait until you are out of your first trimester (12-14 weeks of pregnancy)  Safe Medications in Pregnancy   Acne: Benzoyl Peroxide Salicylic Acid  Backache/Headache: Tylenol: 2 regular strength every 4 hours OR              2 Extra strength every 6 hours  Colds/Coughs/Allergies: Benadryl (alcohol free) 25 mg every 6 hours as needed Breath right strips Claritin Cepacol throat lozenges Chloraseptic throat spray Cold-Eeze- up to three times per day Cough drops, alcohol free Flonase (by prescription only) Guaifenesin Mucinex Robitussin DM (plain only, alcohol free) Saline nasal spray/drops Sudafed (pseudoephedrine) & Actifed ** use only after [redacted] weeks gestation and if you do not have high blood pressure Tylenol Vicks Vaporub Zinc lozenges Zyrtec   Constipation: Colace Ducolax suppositories Fleet enema Glycerin suppositories Metamucil Milk of magnesia Miralax Senokot Smooth move tea  Diarrhea: Kaopectate Imodium A-D  *NO pepto Bismol  Hemorrhoids: Anusol Anusol HC Preparation  H Tucks  Indigestion: Tums Maalox Mylanta Zantac  Pepcid  Insomnia: Benadryl (alcohol free) 25mg every 6 hours as needed Tylenol PM Unisom, no Gelcaps  Leg Cramps: Tums MagGel  Nausea/Vomiting:  Bonine Dramamine Emetrol Ginger extract Sea bands Meclizine  Nausea medication to take during pregnancy:  Unisom (doxylamine succinate 25 mg tablets) Take one tablet daily at bedtime. If symptoms are not adequately controlled, the dose can be increased to a maximum recommended dose of two tablets daily (1/2 tablet in the morning, 1/2 tablet mid-afternoon and one at bedtime). Vitamin B6 100mg tablets. Take one tablet twice a day (up to 200 mg per day).  Skin Rashes: Aveeno products Benadryl cream or 25mg every 6 hours as needed Calamine Lotion 1% cortisone cream  Yeast infection: Gyne-lotrimin 7 Monistat 7   **If taking multiple medications, please check labels to avoid duplicating the same active ingredients **take medication as directed on the label ** Do not exceed 4000 mg of tylenol in 24 hours **Do not take medications that contain aspirin or ibuprofen      

## 2017-05-14 LAB — PMP SCREEN PROFILE (10S), URINE
AMPHETAMINE SCREEN URINE: NEGATIVE ng/mL
BARBITURATE SCREEN URINE: NEGATIVE ng/mL
BENZODIAZEPINE SCREEN, URINE: NEGATIVE ng/mL
CANNABINOIDS UR QL SCN: NEGATIVE ng/mL
COCAINE(METAB.)SCREEN, URINE: NEGATIVE ng/mL
Creatinine(Crt), U: 92.7 mg/dL (ref 20.0–300.0)
Methadone Screen, Urine: NEGATIVE ng/mL
OPIATE SCREEN URINE: NEGATIVE ng/mL
OXYCODONE+OXYMORPHONE UR QL SCN: NEGATIVE ng/mL
Ph of Urine: 6.5 (ref 4.5–8.9)
Phencyclidine Qn, Ur: NEGATIVE ng/mL
Propoxyphene Scrn, Ur: NEGATIVE ng/mL

## 2017-05-14 LAB — URINALYSIS, ROUTINE W REFLEX MICROSCOPIC
Bilirubin, UA: NEGATIVE
Glucose, UA: NEGATIVE
Ketones, UA: NEGATIVE
Leukocytes, UA: NEGATIVE
NITRITE UA: NEGATIVE
PROTEIN UA: NEGATIVE
RBC UA: NEGATIVE
Specific Gravity, UA: 1.016 (ref 1.005–1.030)
UUROB: 0.2 mg/dL (ref 0.2–1.0)
pH, UA: 6.5 (ref 5.0–7.5)

## 2017-05-14 LAB — CBC
HEMATOCRIT: 37.5 % (ref 34.0–46.6)
Hemoglobin: 12.4 g/dL (ref 11.1–15.9)
MCH: 30.4 pg (ref 26.6–33.0)
MCHC: 33.1 g/dL (ref 31.5–35.7)
MCV: 92 fL (ref 79–97)
Platelets: 230 10*3/uL (ref 150–379)
RBC: 4.08 x10E6/uL (ref 3.77–5.28)
RDW: 13.1 % (ref 12.3–15.4)
WBC: 6.4 10*3/uL (ref 3.4–10.8)

## 2017-05-14 LAB — HEPATITIS B SURFACE ANTIGEN: Hepatitis B Surface Ag: NEGATIVE

## 2017-05-14 LAB — ANTIBODY SCREEN: Antibody Screen: NEGATIVE

## 2017-05-14 LAB — RPR: RPR: NONREACTIVE

## 2017-05-14 LAB — HIV ANTIBODY (ROUTINE TESTING W REFLEX): HIV Screen 4th Generation wRfx: NONREACTIVE

## 2017-05-15 LAB — URINE CULTURE

## 2017-05-16 LAB — GC/CHLAMYDIA PROBE AMP
Chlamydia trachomatis, NAA: NEGATIVE
Neisseria gonorrhoeae by PCR: NEGATIVE

## 2017-06-03 ENCOUNTER — Ambulatory Visit (INDEPENDENT_AMBULATORY_CARE_PROVIDER_SITE_OTHER): Payer: Medicaid Other

## 2017-06-03 ENCOUNTER — Encounter: Payer: Self-pay | Admitting: Women's Health

## 2017-06-03 ENCOUNTER — Ambulatory Visit (INDEPENDENT_AMBULATORY_CARE_PROVIDER_SITE_OTHER): Payer: Medicaid Other | Admitting: Women's Health

## 2017-06-03 VITALS — BP 98/60 | HR 76 | Wt 101.0 lb

## 2017-06-03 DIAGNOSIS — Z23 Encounter for immunization: Secondary | ICD-10-CM

## 2017-06-03 DIAGNOSIS — Z3A12 12 weeks gestation of pregnancy: Secondary | ICD-10-CM

## 2017-06-03 DIAGNOSIS — Z331 Pregnant state, incidental: Secondary | ICD-10-CM

## 2017-06-03 DIAGNOSIS — Z3682 Encounter for antenatal screening for nuchal translucency: Secondary | ICD-10-CM

## 2017-06-03 DIAGNOSIS — Z8759 Personal history of other complications of pregnancy, childbirth and the puerperium: Secondary | ICD-10-CM

## 2017-06-03 DIAGNOSIS — Z1389 Encounter for screening for other disorder: Secondary | ICD-10-CM

## 2017-06-03 DIAGNOSIS — Z3402 Encounter for supervision of normal first pregnancy, second trimester: Secondary | ICD-10-CM

## 2017-06-03 DIAGNOSIS — Z3481 Encounter for supervision of other normal pregnancy, first trimester: Secondary | ICD-10-CM

## 2017-06-03 LAB — POCT URINALYSIS DIPSTICK
Blood, UA: NEGATIVE
GLUCOSE UA: NEGATIVE
Ketones, UA: NEGATIVE
LEUKOCYTES UA: NEGATIVE
NITRITE UA: NEGATIVE
Protein, UA: NEGATIVE

## 2017-06-03 NOTE — Patient Instructions (Signed)
[name] [name], I greatly value your feedback.  If you receive a survey following your visit with us today, we appreciate you taking the time to fill it out.  Thanks, Joellyn HaffKim Booker, CNM, WHNP-BC   Second Trimester of Pregnancy The second trimester is from week 14 through week 27 (months 4 through 6). The second trimester is often a time when you feel your best. Your body has adjusted to being pregnant, and you begin to feel better physically. Usually, morning sickness has lessened or quit completely, you may have more energy, and you may have an increase in appetite. The second trimester is also a time when the fetus is growing rapidly. At the end of the sixth month, the fetus is about 9 inches long and weighs about 1 pounds. You will likely begin to feel the baby move (quickening) between 16 and 20 weeks of pregnancy. Body changes during your second trimester Your body continues to go through many changes during your second trimester. The changes vary from woman to woman.  Your weight will continue to increase. You will notice your lower abdomen bulging out.  You may begin to get stretch marks on your hips, abdomen, and breasts.  You may develop headaches that can be relieved by medicines. The medicines should be approved by your health care provider.  You may urinate more often because the fetus is pressing on your bladder.  You may develop or continue to have heartburn as a result of your pregnancy.  You may develop constipation because certain hormones are causing the muscles that push waste through your intestines to slow down.  You may develop hemorrhoids or swollen, bulging veins (varicose veins).  You may have back pain. This is caused by: ? Weight gain. ? Pregnancy hormones that are relaxing the joints in your pelvis. ? A shift in weight and the muscles that support your balance.  Your breasts will continue to grow and they will continue to become tender.  Your gums may  bleed and may be sensitive to brushing and flossing.  Dark spots or blotches (chloasma, mask of pregnancy) may develop on your face. This will likely fade after the baby is born.  A dark line from your belly button to the pubic area (linea nigra) may appear. This will likely fade after the baby is born.  You may have changes in your hair. These can include thickening of your hair, rapid growth, and changes in texture. Some women also have hair loss during or after pregnancy, or hair that feels dry or thin. Your hair will most likely return to normal after your baby is born.  What to expect at prenatal visits During a routine prenatal visit:  You will be weighed to make sure you and the fetus are growing normally.  Your blood pressure will be taken.  Your abdomen will be measured to track your baby's growth.  The fetal heartbeat will be listened to.  Any test results from the previous visit will be discussed.  Your health care provider may ask you:  How you are feeling.  If you are feeling the baby move.  If you have had any abnormal symptoms, such as leaking fluid, bleeding, severe headaches, or abdominal cramping.  If you are using any tobacco products, including cigarettes, chewing tobacco, and electronic cigarettes.  If you have any questions.  Other tests that may be performed during your second trimester include:  Blood tests that check for: ? Low iron levels (anemia). ? High blood  sugar that affects pregnant women (gestational diabetes) between 57 and 28 weeks. ? Rh antibodies. This is to check for a protein on red blood cells (Rh factor).  Urine tests to check for infections, diabetes, or protein in the urine.  An ultrasound to confirm the proper growth and development of the baby.  An amniocentesis to check for possible genetic problems.  Fetal screens for spina bifida and Down syndrome.  HIV (human immunodeficiency virus) testing. Routine prenatal testing  includes screening for HIV, unless you choose not to have this test.  Follow these instructions at home: Medicines  Follow your health care provider's instructions regarding medicine use. Specific medicines may be either safe or unsafe to take during pregnancy.  Take a prenatal vitamin that contains at least 600 micrograms (mcg) of folic acid.  If you develop constipation, try taking a stool softener if your health care provider approves. Eating and drinking  Eat a balanced diet that includes fresh fruits and vegetables, whole grains, good sources of protein such as meat, eggs, or tofu, and low-fat dairy. Your health care provider will help you determine the amount of weight gain that is right for you.  Avoid raw meat and uncooked cheese. These carry germs that can cause birth defects in the baby.  If you have low calcium intake from food, talk to your health care provider about whether you should take a daily calcium supplement.  Limit foods that are high in fat and processed sugars, such as fried and sweet foods.  To prevent constipation: ? Drink enough fluid to keep your urine clear or pale yellow. ? Eat foods that are high in fiber, such as fresh fruits and vegetables, whole grains, and beans. Activity  Exercise only as directed by your health care provider. Most women can continue their usual exercise routine during pregnancy. Try to exercise for 30 minutes at least 5 days a week. Stop exercising if you experience uterine contractions.  Avoid heavy lifting, wear low heel shoes, and practice good posture.  A sexual relationship may be continued unless your health care provider directs you otherwise. Relieving pain and discomfort  Wear a good support bra to prevent discomfort from breast tenderness.  Take warm sitz baths to soothe any pain or discomfort caused by hemorrhoids. Use hemorrhoid cream if your health care provider approves.  Rest with your legs elevated if you have  leg cramps or low back pain.  If you develop varicose veins, wear support hose. Elevate your feet for 15 minutes, 3-4 times a day. Limit salt in your diet. Prenatal Care  Write down your questions. Take them to your prenatal visits.  Keep all your prenatal visits as told by your health care provider. This is important. Safety  Wear your seat belt at all times when driving.  Make a list of emergency phone numbers, including numbers for family, friends, the hospital, and police and fire departments. General instructions  Ask your health care provider for a referral to a local prenatal education class. Begin classes no later than the beginning of month 6 of your pregnancy.  Ask for help if you have counseling or nutritional needs during pregnancy. Your health care provider can offer advice or refer you to specialists for help with various needs.  Do not use hot tubs, steam rooms, or saunas.  Do not douche or use tampons or scented sanitary pads.  Do not cross your legs for long periods of time.  Avoid cat litter boxes and soil used  by cats. These carry germs that can cause birth defects in the baby and possibly loss of the fetus by miscarriage or stillbirth.  Avoid all smoking, herbs, alcohol, and unprescribed drugs. Chemicals in these products can affect the formation and growth of the baby.  Do not use any products that contain nicotine or tobacco, such as cigarettes and e-cigarettes. If you need help quitting, ask your health care provider.  Visit your dentist if you have not gone yet during your pregnancy. Use a soft toothbrush to brush your teeth and be gentle when you floss. Contact a health care provider if:  You have dizziness.  You have mild pelvic cramps, pelvic pressure, or nagging pain in the abdominal area.  You have persistent nausea, vomiting, or diarrhea.  You have a bad smelling vaginal discharge.  You have pain when you urinate. Get help right away if:  You  have a fever.  You are leaking fluid from your vagina.  You have spotting or bleeding from your vagina.  You have severe abdominal cramping or pain.  You have rapid weight gain or weight loss.  You have shortness of breath with chest pain.  You notice sudden or extreme swelling of your face, hands, ankles, feet, or legs.  You have not felt your baby move in over an hour.  You have severe headaches that do not go away when you take medicine.  You have vision changes. Summary  The second trimester is from week 14 through week 27 (months 4 through 6). It is also a time when the fetus is growing rapidly.  Your body goes through many changes during pregnancy. The changes vary from woman to woman.  Avoid all smoking, herbs, alcohol, and unprescribed drugs. These chemicals affect the formation and growth your baby.  Do not use any tobacco products, such as cigarettes, chewing tobacco, and e-cigarettes. If you need help quitting, ask your health care provider.  Contact your health care provider if you have any questions. Keep all prenatal visits as told by your health care provider. This is important. This information is not intended to replace advice given to you by your health care provider. Make sure you discuss any questions you have with your health care provider. Document Released: 07/21/2001 Document Revised: 01/02/2016 Document Reviewed: 09/27/2012 [employer] Interactive Patient Education  2017 [employer].

## 2017-06-03 NOTE — Progress Notes (Signed)
   LOW-RISK PREGNANCY VISIT Patient name: [name] [name] MRN [id number]  Date of birth: 04/01/1996 Chief Complaint:   Routine Prenatal Visit (1st IT)  History of Present Illness:   [name] [name] is a 21 y.o. 862P1001 female at 6647w3d with an Estimated Date of Delivery: 12/13/17 being seen today for ongoing management of a low-risk pregnancy.  Today she reports no complaints.  . Vag. Bleeding: None.  Movement: Absent. denies leaking of fluid. Review of Systems:   Pertinent items are noted in HPI Denies abnormal vaginal discharge w/ itching/odor/irritation, headaches, visual changes, shortness of breath, chest pain, abdominal pain, severe nausea/vomiting, or problems with urination or bowel movements unless otherwise stated above. Pertinent History Reviewed:  Reviewed past medical,surgical, social, obstetrical and family history.  Reviewed problem list, medications and allergies. Physical Assessment:   Vitals:   06/03/17 1428  BP: 98/60  Pulse: 76  Weight: 101 lb (45.8 kg)  Body mass index is 16.81 kg/m.        Physical Examination:   General appearance: Well appearing, and in no distress  Mental status: Alert, oriented to person, place, and time  Skin: Warm & dry  Cardiovascular: Normal heart rate noted  Respiratory: Normal respiratory effort, no distress  Abdomen: Soft, gravid, nontender  Pelvic: Cervical exam deferred         Extremities: Edema: None  Fetal Status: Fetal Heart Rate (bpm): 162 u/s   Movement: Absent    US 12+3 wks,measurements c/w dates,normal ovaries bilat,NB present,NT 1.6 mm,fhr 162 bpm,crl 63.69 mm,ant pl gr 0  Results for orders placed or performed in visit on 06/03/17 (from the past 24 hour(s))  POCT urinalysis dipstick   Collection Time: 06/03/17  2:22 PM  Result Value Ref Range   Color, UA     Clarity, UA     Glucose, UA neg    Bilirubin, UA     Ketones, UA neg    Spec Grav, UA  1.010 - 1.025   Blood, UA neg    pH, UA  5.0 - 8.0   Protein, UA neg    Urobilinogen, UA  0.2 or 1.0 E.U./dL   Nitrite, UA neg    Leukocytes, UA Negative Negative    Assessment & Plan:  1) Low-risk pregnancy G2P1001 at 7047w3d with an Estimated Date of Delivery: 12/13/17    Labs/procedures today: 1st IT/NT, flu shot  Plan:  Continue routine obstetrical care   Reviewed: today's nt u/s,  Preterm labor symptoms and general obstetric precautions including but not limited to vaginal bleeding, contractions, leaking of fluid and fetal movement were reviewed in detail with the patient.  All questions were answered  Follow-up: Return in about 4 weeks (around 07/01/2017) for LROB, 2nd IT.  Orders Placed This Encounter  Procedures  . Flu Vaccine QUAD 36+ mos IM  . Integrated 1  . POCT urinalysis dipstick   Marge DuncansBooker, Brenae Lasecki Randall CNM, [facility] Center[facility]NP-BC 06/03/2017 3:15 PM

## 2017-06-03 NOTE — Progress Notes (Signed)
US 12+3 wks,measurements c/w dates,normal ovaries bilat,NB present,NT 1.6 mm,fhr 162 bpm,crl 63.69 mm,ant pl gr 0

## 2017-06-07 LAB — INTEGRATED 1
Crown Rump Length: 63.7 mm
Gest. Age on Collection Date: 12.7 weeks
MATERNAL AGE AT EDD: 23.3 a
NUCHAL TRANSLUCENCY (NT): 1.6 mm
NUMBER OF FETUSES: 1
PAPP-A VALUE: 895.2 ng/mL
Weight: 101 [lb_av]

## 2017-06-30 ENCOUNTER — Ambulatory Visit (INDEPENDENT_AMBULATORY_CARE_PROVIDER_SITE_OTHER): Payer: Medicaid Other | Admitting: Obstetrics and Gynecology

## 2017-06-30 VITALS — BP 110/70 | HR 82 | Wt 104.4 lb

## 2017-06-30 DIAGNOSIS — Z3A16 16 weeks gestation of pregnancy: Secondary | ICD-10-CM

## 2017-06-30 DIAGNOSIS — Z3482 Encounter for supervision of other normal pregnancy, second trimester: Secondary | ICD-10-CM

## 2017-06-30 DIAGNOSIS — Z8759 Personal history of other complications of pregnancy, childbirth and the puerperium: Secondary | ICD-10-CM

## 2017-06-30 DIAGNOSIS — Z1379 Encounter for other screening for genetic and chromosomal anomalies: Secondary | ICD-10-CM

## 2017-06-30 DIAGNOSIS — Z3402 Encounter for supervision of normal first pregnancy, second trimester: Secondary | ICD-10-CM

## 2017-06-30 NOTE — Progress Notes (Signed)
[name] [name] is a 21 y.o. female G2P1001  Estimated Date of Delivery: 12/13/17 LROB 5929w2d  Chief Complaint  Patient presents with  . Routine Prenatal Visit    IT 2   ____  Patient has no complaints. Her first baby was 6lbs 3oz. Patient weighed 5lb 12 oz at her birth.  Filed Weights   06/30/17 1354  Weight: 104 lb 6.4 oz (47.4 kg)    Patient reports  Absent fetal movement, too early                           denies any bleeding , rupture of membranes,or regular contractions.  Blood pressure 110/70, pulse 82, weight 104 lb 6.4 oz (47.4 kg), last menstrual period 02/12/2017.   Urine results: not done refer to the ob flow sheet for FH and FHR, ,                          Physical Examination: General appearance - alert, well appearing, and in no distress                                      Abdomen - FH  Not indicated                                                        -FHR 150                                                         soft, nontender, nondistended, no masses or organomegaly                                      Pelvic - Not indicated                                            Questions were answered. Assessment: LROB G2P1001 @ 6929w2d Estimated Date of Delivery: 12/13/17  Plan:  Continued routine obstetrical care, LROB  F/u in 4 weeks for LROB   By signing my name below, I, Izna Ahmed, attest that this documentation has been prepared under the direction and in the presence of Tilda BurrowFerguson, Macedonio Scallon V, MD. Electronically Signed: Redge GainerIzna Ahmed, Medical Scribe. 06/30/17. 2:01 PM.  I personally performed the services described in this documentation, which was SCRIBED in my presence. The recorded information has been reviewed and considered accurate. It has been edited as necessary during review. Tilda BurrowJohn V Ameira Alessandrini, MD

## 2017-06-30 NOTE — Patient Instructions (Signed)
(  [phone] is the phone number for Pregnancy Classes or hospital tours at [facility].  ° °You will be referred to  [email or web address]   for more information on childbirth classes   °At this site you may register for classes. You may sign up for a waiting list if classes are full. Please SIGN UP FOR THIS!.   When the waiting list becomes long, sometimes new classes can be added. ° ° ° °

## 2017-07-06 LAB — INTEGRATED 2
ADSF: 1.31
AFP MOM: 0.69
Alpha-Fetoprotein: 28.7 ng/mL
Crown Rump Length: 63.7 mm
DIA MoM: 0.7
DIA VALUE: 150.7 pg/mL
Estriol, Unconjugated: 1.23 ng/mL
GEST. AGE ON COLLECTION DATE: 12.7 wk
GESTATIONAL AGE: 16.6 wk
HCG MOM: 1.72
HCG VALUE: 62.6 [IU]/mL
Maternal Age at EDD: 23.3 yr
NUCHAL TRANSLUCENCY (NT): 1.6 mm
NUMBER OF FETUSES: 1
Nuchal Translucency MoM: 1
PAPP-A MOM: 0.56
PAPP-A Value: 895.2 ng/mL
Test Results:: NEGATIVE
WEIGHT: 101 [lb_av]
Weight: 101 [lb_av]

## 2017-07-26 ENCOUNTER — Telehealth: Payer: Self-pay | Admitting: Women's Health

## 2017-07-26 NOTE — Telephone Encounter (Signed)
Patient states she called the after hours nurse for spotting after intercourse. She is not having any spotting currently or cramping or leaking. Advised patient to keep appointment for Wednesday and to continue to monitor. Informed spotting after intercourse can occur.  Verbalized understanding.

## 2017-07-28 ENCOUNTER — Encounter: Payer: Self-pay | Admitting: Advanced Practice Midwife

## 2017-07-28 ENCOUNTER — Ambulatory Visit (INDEPENDENT_AMBULATORY_CARE_PROVIDER_SITE_OTHER): Payer: Medicaid Other | Admitting: Advanced Practice Midwife

## 2017-07-28 ENCOUNTER — Other Ambulatory Visit (INDEPENDENT_AMBULATORY_CARE_PROVIDER_SITE_OTHER): Payer: Medicaid Other

## 2017-07-28 VITALS — BP 100/60 | HR 99 | Wt 109.0 lb

## 2017-07-28 DIAGNOSIS — Z363 Encounter for antenatal screening for malformations: Secondary | ICD-10-CM

## 2017-07-28 DIAGNOSIS — Z3A20 20 weeks gestation of pregnancy: Secondary | ICD-10-CM

## 2017-07-28 DIAGNOSIS — Z1389 Encounter for screening for other disorder: Secondary | ICD-10-CM

## 2017-07-28 DIAGNOSIS — Z3482 Encounter for supervision of other normal pregnancy, second trimester: Secondary | ICD-10-CM

## 2017-07-28 DIAGNOSIS — Z3402 Encounter for supervision of normal first pregnancy, second trimester: Secondary | ICD-10-CM

## 2017-07-28 DIAGNOSIS — Z331 Pregnant state, incidental: Secondary | ICD-10-CM

## 2017-07-28 LAB — POCT URINALYSIS DIPSTICK
Glucose, UA: NEGATIVE
KETONES UA: NEGATIVE
Leukocytes, UA: NEGATIVE
Nitrite, UA: NEGATIVE
Protein, UA: NEGATIVE
RBC UA: NEGATIVE

## 2017-07-28 NOTE — Progress Notes (Signed)
US 20+2 wks,cephalic,anterior pl gr 0,normal ovaries bilat,cx 3 cm,svp of fluid 6.8 cm,fhr 148 bpm,efw 355 g,anatomy complete,no obvious abnormalities

## 2017-07-28 NOTE — Progress Notes (Signed)
G2P1001 409w2d Estimated Date of Delivery: 12/13/17  Blood pressure 100/60, pulse 99, weight 109 lb (49.4 kg), last menstrual period 02/12/2017.   BP weight and urine results all reviewed and noted.  Please refer to the obstetrical flow sheet for the fundal height and fetal heart rate documentation: Anatomy scan was not scheduled . [name] [name] has an opening later today.  Patient reports good fetal movement, denies any bleeding and no rupture of membranes symptoms or regular contractions. Had small spotting after sex a few days ago. SSE:  Normal, no bleeding.  Patient is without complaints. All questions were answered.  Orders Placed This Encounter  Procedures  . US OB Comp + 14 Wk  . POCT urinalysis dipstick    Plan:  Continued routine obstetrical care, anatomy scan after visit   Return in about 4 weeks (around 08/25/2017) for LROB.

## 2017-07-28 NOTE — Patient Instructions (Signed)
[name] [name], I greatly value your feedback.  If you receive a survey following your visit with us today, we appreciate you taking the time to fill it out.  Thanks, [name] [name], CNM     Second Trimester of Pregnancy The second trimester is from week 14 through week 27 (months 4 through 6). The second trimester is often a time when you feel your best. Your body has adjusted to being pregnant, and you begin to feel better physically. Usually, morning sickness has lessened or quit completely, you may have more energy, and you may have an increase in appetite. The second trimester is also a time when the fetus is growing rapidly. At the end of the sixth month, the fetus is about 9 inches long and weighs about 1 pounds. You will likely begin to feel the baby move (quickening) between 16 and 20 weeks of pregnancy. Body changes during your second trimester Your body continues to go through many changes during your second trimester. The changes vary from woman to woman.  Your weight will continue to increase. You will notice your lower abdomen bulging out.  You may begin to get stretch marks on your hips, abdomen, and breasts.  You may develop headaches that can be relieved by medicines. The medicines should be approved by your health care provider.  You may urinate more often because the fetus is pressing on your bladder.  You may develop or continue to have heartburn as a result of your pregnancy.  You may develop constipation because certain hormones are causing the muscles that push waste through your intestines to slow down.  You may develop hemorrhoids or swollen, bulging veins (varicose veins).  You may have back pain. This is caused by: ? Weight gain. ? Pregnancy hormones that are relaxing the joints in your pelvis. ? A shift in weight and the muscles that support your balance.  Your breasts will continue to grow and they will continue to become tender.  Your gums  may bleed and may be sensitive to brushing and flossing.  Dark spots or blotches (chloasma, mask of pregnancy) may develop on your face. This will likely fade after the baby is born.  A dark line from your belly button to the pubic area (linea nigra) may appear. This will likely fade after the baby is born.  You may have changes in your hair. These can include thickening of your hair, rapid growth, and changes in texture. Some women also have hair loss during or after pregnancy, or hair that feels dry or thin. Your hair will most likely return to normal after your baby is born.  What to expect at prenatal visits During a routine prenatal visit:  You will be weighed to make sure you and the fetus are growing normally.  Your blood pressure will be taken.  Your abdomen will be measured to track your baby's growth.  The fetal heartbeat will be listened to.  Any test results from the previous visit will be discussed.  Your health care provider may ask you:  How you are feeling.  If you are feeling the baby move.  If you have had any abnormal symptoms, such as leaking fluid, bleeding, severe headaches, or abdominal cramping.  If you are using any tobacco products, including cigarettes, chewing tobacco, and electronic cigarettes.  If you have any questions.  Other tests that may be performed during your second trimester include:  Blood tests that check for: ? Low iron levels (anemia). ? High  blood sugar that affects pregnant women (gestational diabetes) between 42 and 28 weeks. ? Rh antibodies. This is to check for a protein on red blood cells (Rh factor).  Urine tests to check for infections, diabetes, or protein in the urine.  An ultrasound to confirm the proper growth and development of the baby.  An amniocentesis to check for possible genetic problems.  Fetal screens for spina bifida and Down syndrome.  HIV (human immunodeficiency virus) testing. Routine prenatal testing  includes screening for HIV, unless you choose not to have this test.  Follow these instructions at home: Medicines  Follow your health care provider's instructions regarding medicine use. Specific medicines may be either safe or unsafe to take during pregnancy.  Take a prenatal vitamin that contains at least 600 micrograms (mcg) of folic acid.  If you develop constipation, try taking a stool softener if your health care provider approves. Eating and drinking  Eat a balanced diet that includes fresh fruits and vegetables, whole grains, good sources of protein such as meat, eggs, or tofu, and low-fat dairy. Your health care provider will help you determine the amount of weight gain that is right for you.  Avoid raw meat and uncooked cheese. These carry germs that can cause birth defects in the baby.  If you have low calcium intake from food, talk to your health care provider about whether you should take a daily calcium supplement.  Limit foods that are high in fat and processed sugars, such as fried and sweet foods.  To prevent constipation: ? Drink enough fluid to keep your urine clear or pale yellow. ? Eat foods that are high in fiber, such as fresh fruits and vegetables, whole grains, and beans. Activity  Exercise only as directed by your health care provider. Most women can continue their usual exercise routine during pregnancy. Try to exercise for 30 minutes at least 5 days a week. Stop exercising if you experience uterine contractions.  Avoid heavy lifting, wear low heel shoes, and practice good posture.  A sexual relationship may be continued unless your health care provider directs you otherwise. Relieving pain and discomfort  Wear a good support bra to prevent discomfort from breast tenderness.  Take warm sitz baths to soothe any pain or discomfort caused by hemorrhoids. Use hemorrhoid cream if your health care provider approves.  Rest with your legs elevated if you have  leg cramps or low back pain.  If you develop varicose veins, wear support hose. Elevate your feet for 15 minutes, 3-4 times a day. Limit salt in your diet. Prenatal Care  Write down your questions. Take them to your prenatal visits.  Keep all your prenatal visits as told by your health care provider. This is important. Safety  Wear your seat belt at all times when driving.  Make a list of emergency phone numbers, including numbers for family, friends, the hospital, and police and fire departments. General instructions  Ask your health care provider for a referral to a local prenatal education class. Begin classes no later than the beginning of month 6 of your pregnancy.  Ask for help if you have counseling or nutritional needs during pregnancy. Your health care provider can offer advice or refer you to specialists for help with various needs.  Do not use hot tubs, steam rooms, or saunas.  Do not douche or use tampons or scented sanitary pads.  Do not cross your legs for long periods of time.  Avoid cat litter boxes and soil  used by cats. These carry germs that can cause birth defects in the baby and possibly loss of the fetus by miscarriage or stillbirth.  Avoid all smoking, herbs, alcohol, and unprescribed drugs. Chemicals in these products can affect the formation and growth of the baby.  Do not use any products that contain nicotine or tobacco, such as cigarettes and e-cigarettes. If you need help quitting, ask your health care provider.  Visit your dentist if you have not gone yet during your pregnancy. Use a soft toothbrush to brush your teeth and be gentle when you floss. Contact a health care provider if:  You have dizziness.  You have mild pelvic cramps, pelvic pressure, or nagging pain in the abdominal area.  You have persistent nausea, vomiting, or diarrhea.  You have a bad smelling vaginal discharge.  You have pain when you urinate. Get help right away if:  You  have a fever.  You are leaking fluid from your vagina.  You have spotting or bleeding from your vagina.  You have severe abdominal cramping or pain.  You have rapid weight gain or weight loss.  You have shortness of breath with chest pain.  You notice sudden or extreme swelling of your face, hands, ankles, feet, or legs.  You have not felt your baby move in over an hour.  You have severe headaches that do not go away when you take medicine.  You have vision changes. Summary  The second trimester is from week 14 through week 27 (months 4 through 6). It is also a time when the fetus is growing rapidly.  Your body goes through many changes during pregnancy. The changes vary from woman to woman.  Avoid all smoking, herbs, alcohol, and unprescribed drugs. These chemicals affect the formation and growth your baby.  Do not use any tobacco products, such as cigarettes, chewing tobacco, and e-cigarettes. If you need help quitting, ask your health care provider.  Contact your health care provider if you have any questions. Keep all prenatal visits as told by your health care provider. This is important. This information is not intended to replace advice given to you by your health care provider. Make sure you discuss any questions you have with your health care provider.      CHILDBIRTH CLASSES [phone] is the phone number for Pregnancy Classes or hospital tours at [facility] will be referred to  [email or web address] for more information on childbirth classes  At this site you may register for classes. You may sign up for a waiting list if classes are full. Please SIGN UP FOR THIS!.   When the waiting list becomes long, sometimes new classes can be added.

## 2017-07-29 ENCOUNTER — Emergency Department (HOSPITAL_COMMUNITY)
Admission: EM | Admit: 2017-07-29 | Discharge: 2017-07-30 | Disposition: A | Discharge status: Home / Self Care | Payer: Medicaid Other | Attending: Emergency Medicine | Admitting: Emergency Medicine

## 2017-07-29 ENCOUNTER — Encounter (HOSPITAL_COMMUNITY): Payer: Self-pay

## 2017-07-29 ENCOUNTER — Other Ambulatory Visit: Payer: Self-pay

## 2017-07-29 DIAGNOSIS — Z87891 Personal history of nicotine dependence: Secondary | ICD-10-CM | POA: Insufficient documentation

## 2017-07-29 DIAGNOSIS — Z79899 Other long term (current) drug therapy: Secondary | ICD-10-CM | POA: Diagnosis not present

## 2017-07-29 DIAGNOSIS — J029 Acute pharyngitis, unspecified: Secondary | ICD-10-CM | POA: Insufficient documentation

## 2017-07-29 DIAGNOSIS — O26892 Other specified pregnancy related conditions, second trimester: Secondary | ICD-10-CM | POA: Diagnosis not present

## 2017-07-29 DIAGNOSIS — R51 Headache: Secondary | ICD-10-CM | POA: Diagnosis not present

## 2017-07-29 DIAGNOSIS — K529 Noninfective gastroenteritis and colitis, unspecified: Secondary | ICD-10-CM | POA: Diagnosis not present

## 2017-07-29 DIAGNOSIS — O218 Other vomiting complicating pregnancy: Secondary | ICD-10-CM | POA: Diagnosis not present

## 2017-07-29 DIAGNOSIS — R197 Diarrhea, unspecified: Secondary | ICD-10-CM | POA: Diagnosis not present

## 2017-07-29 DIAGNOSIS — R1013 Epigastric pain: Secondary | ICD-10-CM | POA: Diagnosis not present

## 2017-07-29 DIAGNOSIS — Z3490 Encounter for supervision of normal pregnancy, unspecified, unspecified trimester: Secondary | ICD-10-CM

## 2017-07-29 DIAGNOSIS — O219 Vomiting of pregnancy, unspecified: Secondary | ICD-10-CM | POA: Diagnosis present

## 2017-07-29 LAB — BASIC METABOLIC PANEL
Anion gap: 11 (ref 5–15)
BUN: 7 mg/dL (ref 6–20)
CALCIUM: 8.9 mg/dL (ref 8.9–10.3)
CO2: 23 mmol/L (ref 22–32)
CREATININE: 0.44 mg/dL (ref 0.44–1.00)
Chloride: 99 mmol/L — ABNORMAL LOW (ref 101–111)
Glucose, Bld: 101 mg/dL — ABNORMAL HIGH (ref 65–99)
Potassium: 3.4 mmol/L — ABNORMAL LOW (ref 3.5–5.1)
SODIUM: 133 mmol/L — AB (ref 135–145)

## 2017-07-29 LAB — URINALYSIS, ROUTINE W REFLEX MICROSCOPIC
Bilirubin Urine: NEGATIVE
Glucose, UA: NEGATIVE mg/dL
Hgb urine dipstick: NEGATIVE
KETONES UR: NEGATIVE mg/dL
Nitrite: NEGATIVE
PROTEIN: NEGATIVE mg/dL
Specific Gravity, Urine: 1.024 (ref 1.005–1.030)
pH: 5 (ref 5.0–8.0)

## 2017-07-29 LAB — RAPID STREP SCREEN (MED CTR MEBANE ONLY): STREPTOCOCCUS, GROUP A SCREEN (DIRECT): NEGATIVE

## 2017-07-29 MED ORDER — METOCLOPRAMIDE HCL 5 MG/ML IJ SOLN
5.0000 mg | Freq: Once | INTRAMUSCULAR | Status: AC
  Administered 2017-07-29: 5 mg via INTRAVENOUS
  Filled 2017-07-29: qty 2

## 2017-07-29 MED ORDER — SODIUM CHLORIDE 0.9 % IV BOLUS (SEPSIS)
500.0000 mL | Freq: Once | INTRAVENOUS | Status: AC
  Administered 2017-07-29: 500 mL via INTRAVENOUS

## 2017-07-29 MED ORDER — SODIUM CHLORIDE 0.9 % IV SOLN
1000.0000 mL | INTRAVENOUS | Status: DC
Start: 2017-07-29 — End: 2017-07-30
  Administered 2017-07-29: 1000 mL via INTRAVENOUS

## 2017-07-29 MED ORDER — ACETAMINOPHEN 500 MG PO TABS
1000.0000 mg | ORAL_TABLET | Freq: Once | ORAL | Status: AC
  Administered 2017-07-29: 1000 mg via ORAL
  Filled 2017-07-29: qty 2

## 2017-07-29 MED ORDER — METOCLOPRAMIDE HCL 10 MG PO TABS: 10.0000 mg | ORAL_TABLET | Freq: Four times a day (QID) | ORAL | 0 refills | Status: DC

## 2017-07-29 NOTE — ED Notes (Addendum)
Pt drinking Dr Reino KentPepper in triage.

## 2017-07-29 NOTE — ED Provider Notes (Signed)
[facility] EMERGENCY DEPARTMENT Provider Note   CSN: [id number] Arrival date & time: 07/29/17  2048     History   Chief Complaint Chief Complaint  Patient presents with  . Diarrhea    head congestion, headache, nausea    HPI [name] [name] is a 21 y.o. female.  Patient is a 21 year old female who presents to the emergency department with vomiting and diarrhea.  The patient states that earlier today she began to have diarrhea.  She had a total of 3 episodes of diarrhea today.  The patient states she is also had some problems with sore throat.  At times she feels as though she cannot get solid food down, but has been able to get some liquids down.  Patient also complains of headache, and some epigastric area pain accompanied by nausea.  It is of note that the patient is 5 months pregnant.  She was just seen by her GYN physician on yesterday and everything related to the pregnancy seems to be moving along nicely.  Patient presents now because of the nausea and vomiting, as well as problems with headache and concern for possible strep infection or other problems.      Past Medical History:  Diagnosis Date  . Postpartum depression 02/19/2014    Patient Active Problem List   Diagnosis Date Noted  . History of prior pregnancy with IUGR newborn 06/03/2017  . Supervision of normal pregnancy 05/13/2017  . Postpartum depression 02/19/2014  . Marijuana use 09/05/2013  . Susceptible to varicella (non-immune), currently pregnant 08/15/2013    Past Surgical History:  Procedure Laterality Date  . MYRINGOTOMY    . TONSILLECTOMY      OB History    Gravida Para Term Preterm AB Living   2 1 1     1    SAB TAB Ectopic Multiple Live Births           1       Home Medications    Prior to Admission medications   Medication Sig Start Date End Date Taking? Authorizing Provider  acetaminophen (TYLENOL) 325 MG tablet Take 650 mg by mouth every 6 (six) hours as needed for mild  pain or moderate pain.   Yes [provider]  prenatal vitamin w/FE, FA (PRENATAL 1 + 1) 27-1 MG TABS tablet Take 1 tablet by mouth daily at 12 noon. 04/27/17  Yes Adline Potter, NP    Family History Family History  Problem Relation Age of Onset  . Cancer Maternal Grandfather        stomach, throat  . Diabetes Cousin   . Alcohol abuse Neg Hx   . Arthritis Neg Hx   . Asthma Neg Hx   . COPD Neg Hx   . Birth defects Neg Hx   . Depression Neg Hx   . Drug abuse Neg Hx   . Early death Neg Hx   . Hearing loss Neg Hx   . Heart disease Neg Hx   . Hyperlipidemia Neg Hx   . Hypertension Neg Hx   . Kidney disease Neg Hx   . Learning disabilities Neg Hx   . Mental illness Neg Hx   . Miscarriages / Stillbirths Neg Hx   . Mental retardation Neg Hx   . Vision loss Neg Hx   . Stroke Neg Hx   . Varicose Veins Neg Hx     Social History Social History   Tobacco Use  . Smoking status: Former Smoker    Packs/day:  0.50    Years: 2.00    Pack years: 1.00    Types: Cigarettes    Last attempt to quit: 03/10/2016    Years since quitting: 1.3  . Smokeless tobacco: Never Used  Substance Use Topics  . Alcohol use: No  . Drug use: No     Allergies   Patient has no known allergies.   Review of Systems Review of Systems  Constitutional: Positive for appetite change. Negative for activity change and fever.       All ROS Neg except as noted in HPI  HENT: Positive for congestion, postnasal drip, sore throat and trouble swallowing. Negative for nosebleeds.   Eyes: Negative for photophobia and discharge.  Respiratory: Negative for cough, shortness of breath and wheezing.   Cardiovascular: Negative for chest pain and palpitations.  Gastrointestinal: Positive for nausea and vomiting. Negative for abdominal pain and blood in stool.  Genitourinary: Negative for dysuria, frequency and hematuria.  Musculoskeletal: Negative for arthralgias, back pain and neck pain.  Skin: Negative.     Neurological: Positive for headaches. Negative for dizziness, seizures and speech difficulty.  Psychiatric/Behavioral: Negative for confusion and hallucinations.     Physical Exam Updated Vital Signs BP 105/75 (BP Location: Right Arm)   Pulse (!) 115   Temp 98.3 F (36.8 C) (Oral)   Resp 14   Ht 5\' 5"  (1.651 m)   Wt 49.4 kg (109 lb)   LMP 02/12/2017 (Exact Date)   SpO2 100%   BMI 18.14 kg/m   Physical Exam  Constitutional: She is oriented to person, place, and time. She appears well-developed and well-nourished.  Non-toxic appearance.  HENT:  Head: Normocephalic.  Right Ear: Tympanic membrane and external ear normal.  Left Ear: Tympanic membrane and external ear normal.  Nasal congestion present.  There is no swelling or redness or increased warmth of the mastoid area.  The airway is patent.  The uvula is in the midline.  There is no exudate noted.  There is some mild increased redness of the posterior pharynx.  Eyes: EOM and lids are normal. Pupils are equal, round, and reactive to light.  Neck: Normal range of motion. Neck supple. Carotid bruit is not present.  Cardiovascular: Regular rhythm, normal heart sounds, intact distal pulses and normal pulses. Tachycardia present.  Pulmonary/Chest: Breath sounds normal. No respiratory distress.  Abdominal: Soft. Bowel sounds are normal. There is no tenderness. There is no guarding.  Musculoskeletal: Normal range of motion.  Lymphadenopathy:       Head (right side): No submandibular adenopathy present.       Head (left side): No submandibular adenopathy present.    She has no cervical adenopathy.  Neurological: She is alert and oriented to person, place, and time. She has normal strength. No cranial nerve deficit or sensory deficit.  Skin: Skin is warm and dry.  Psychiatric: She has a normal mood and affect. Her speech is normal.  Nursing note and vitals reviewed.    ED Treatments / Results  Labs (all labs ordered are listed,  but only abnormal results are displayed) Labs Reviewed  RAPID STREP SCREEN (NOT AT [facility])  URINALYSIS, ROUTINE W REFLEX MICROSCOPIC  BASIC METABOLIC PANEL    EKG  EKG Interpretation None       Radiology No results found.  Procedures Procedures (including critical care time)  Medications Ordered in ED Medications  sodium chloride 0.9 % bolus 500 mL (500 mLs Intravenous New Bag/Given 07/29/17 2141)    Followed by  0.9 %  sodium chloride infusion (not administered)     Initial Impression / Assessment and Plan / ED Course  I have reviewed the triage vital signs and the nursing notes.  Pertinent labs & imaging results that were available during my care of the patient were reviewed by me and considered in my medical decision making (see chart for details).       Final Clinical Impressions(s) / ED Diagnoses mdm Patient's vital signs reviewed.  Patient started on IV fluids, and given a liter bolus.  There was significant improvement in the heart rate after the bolus.  The patient states she feels some better with the bolus as well as with the IV Reglan.  No vomiting after the Reglan was given.  The patient complained of a headache.  2 extra strength Tylenol were given.  The patient states this helped some but did not completely resolve her headache.  Patient was rechecked, and there are no gross neurologic deficits appreciated on examination.  I reviewed the rapid strep test, and the urine analysis and the basic metabolic tests with the patient.  The sodium was slightly low at 133, the potassium slightly low at 3.4, and the glucose was slightly elevated at 101.  Otherwise the test was within normal limits.  I have encouraged the patient to increase fluids.  She is given a prescription for Reglan to use for out on an as-needed basis for nausea.  I have asked the patient to take this medication with her and discuss it with the [facility] facilitators.  The patient  will follow [facility] with [facility].  The patient will [facility] the [facility] at [facility] if any emergent changes, problems, or concerns.   Final diagnoses:  Gastroenteritis  Pregnancy, unspecified gestational age    ED Discharge Orders        Ordered    metoCLOPramide (REGLAN) 10 MG tablet  Every 6 hours     07/29/17 2343       Ivery QualeBryant, Aniello Christopoulos, PA-C 07/30/17 [id number]    Loren RacerYelverton, David, MD 08/02/17 1003

## 2017-07-29 NOTE — Discharge Instructions (Addendum)
Your vital signs are within normal limits.  Your oxygen level is 100 Percent on room air.  Within normal limits by my interpretation.  Your urine test and your basic metabolic panel are negative for severe dehydration.  Your potassium is slightly low.  Please increase foods high in potassium. Use Reglan every 6 hours for nausea if needed. Use tylenol extra strength for headache. Please see Dr. Despina HiddenEure or member of his team if not improving.  See the [facility] if any emergent changes, problems, or concerns.

## 2017-07-29 NOTE — ED Triage Notes (Addendum)
Pt reports diarrhea today, 3 episodes today, accompanied by head congestion, sore throat, headache, epigastric pain and nausea that started today. Pt is currently 5 months pregnant.

## 2017-08-01 LAB — CULTURE, GROUP A STREP (THRC)

## 2017-08-10 NOTE — L&D Delivery Note (Signed)
      Delivery Note Pt labored nicely, was noted to be C/C/+3 w/BBOW hanging out of vagina. AROM w/clear fluid.  1.5 pushes later, At 6:25 AM a viable female was delivered via Vaginal, Spontaneous (Presentation: OA  ).  APGAR: 9, 10; weight pending .  After 1 minute, the cord was clamped and cut. 40 units of pitocin diluted in 1000cc LR was infused rapidly IV.Placenta status: The cervix was noted to be clamped down to 4-5 cms w/placenta still in uterus.  Anesthesia and Dr Alysia Penna called.  Nitro glycerin was given IV and DR. Alysia Penna manually removed the placenta in several pieces. Manual sweep of uterus and mefoxin ordered X1 .  Anesthesia:epidural   Episiotomy: None Lacerations: None Suture Repair:  Est. Blood Loss (mL):  300  Mom to postpartum.  Baby to Couplet care / Skin to Skin.  [name] [name] 12/10/2017, 7:09 AM   Please schedule this patient for PP visit in: 4 weeks Low risk pregnancy complicated by:  Delivery mode:  SVD Anticipated Birth Control:  Depo PP Procedures needed:   Schedule Integrated BH visit:  Provider: Any provider

## 2017-08-25 ENCOUNTER — Ambulatory Visit (INDEPENDENT_AMBULATORY_CARE_PROVIDER_SITE_OTHER): Payer: Medicaid Other | Admitting: Advanced Practice Midwife

## 2017-08-25 VITALS — BP 112/66 | HR 88 | Wt 118.0 lb

## 2017-08-25 DIAGNOSIS — Z1389 Encounter for screening for other disorder: Secondary | ICD-10-CM

## 2017-08-25 DIAGNOSIS — Z3A24 24 weeks gestation of pregnancy: Secondary | ICD-10-CM

## 2017-08-25 DIAGNOSIS — Z3482 Encounter for supervision of other normal pregnancy, second trimester: Secondary | ICD-10-CM

## 2017-08-25 DIAGNOSIS — Z8759 Personal history of other complications of pregnancy, childbirth and the puerperium: Secondary | ICD-10-CM

## 2017-08-25 DIAGNOSIS — Z331 Pregnant state, incidental: Secondary | ICD-10-CM

## 2017-08-25 LAB — POCT URINALYSIS DIPSTICK
Glucose, UA: NEGATIVE
Ketones, UA: NEGATIVE
LEUKOCYTES UA: NEGATIVE
NITRITE UA: NEGATIVE
PROTEIN UA: NEGATIVE
RBC UA: NEGATIVE

## 2017-08-25 NOTE — Progress Notes (Signed)
G2P1001 7233w2d Estimated Date of Delivery: 12/13/17  Blood pressure 112/66, pulse 88, weight 118 lb (53.5 kg), last menstrual period 02/12/2017.   BP weight and urine results all reviewed and noted.  Please refer to the obstetrical flow sheet for the fundal height and fetal heart rate documentation:  Patient reports good fetal movement, denies any bleeding and no rupture of membranes symptoms or regular contractions. Patient is without complaints. All questions were answered.  Orders Placed This Encounter  Procedures  . US OB Follow Up  . POCT Urinalysis Dipstick    Plan:  Continued routine obstetrical care,   Return in about 4 weeks (around 09/22/2017) for PN2/LROB, US:EFW.(Hx IUGR)

## 2017-08-25 NOTE — Patient Instructions (Signed)

## 2017-09-22 ENCOUNTER — Ambulatory Visit (INDEPENDENT_AMBULATORY_CARE_PROVIDER_SITE_OTHER): Payer: Medicaid Other | Admitting: Advanced Practice Midwife

## 2017-09-22 ENCOUNTER — Other Ambulatory Visit: Payer: Medicaid Other

## 2017-09-22 ENCOUNTER — Ambulatory Visit (INDEPENDENT_AMBULATORY_CARE_PROVIDER_SITE_OTHER): Payer: Medicaid Other

## 2017-09-22 ENCOUNTER — Encounter: Payer: Self-pay | Admitting: Advanced Practice Midwife

## 2017-09-22 VITALS — BP 100/70 | HR 97 | Wt 122.4 lb

## 2017-09-22 DIAGNOSIS — Z3482 Encounter for supervision of other normal pregnancy, second trimester: Secondary | ICD-10-CM | POA: Diagnosis not present

## 2017-09-22 DIAGNOSIS — Z8759 Personal history of other complications of pregnancy, childbirth and the puerperium: Secondary | ICD-10-CM

## 2017-09-22 DIAGNOSIS — Z3A28 28 weeks gestation of pregnancy: Secondary | ICD-10-CM

## 2017-09-22 DIAGNOSIS — Z3483 Encounter for supervision of other normal pregnancy, third trimester: Secondary | ICD-10-CM

## 2017-09-22 DIAGNOSIS — Z331 Pregnant state, incidental: Secondary | ICD-10-CM

## 2017-09-22 DIAGNOSIS — Z1389 Encounter for screening for other disorder: Secondary | ICD-10-CM

## 2017-09-22 DIAGNOSIS — Z3402 Encounter for supervision of normal first pregnancy, second trimester: Secondary | ICD-10-CM

## 2017-09-22 DIAGNOSIS — Z131 Encounter for screening for diabetes mellitus: Secondary | ICD-10-CM

## 2017-09-22 LAB — POCT URINALYSIS DIPSTICK
Blood, UA: NEGATIVE
Glucose, UA: NEGATIVE
Ketones, UA: NEGATIVE
Leukocytes, UA: NEGATIVE
NITRITE UA: NEGATIVE
PROTEIN UA: NEGATIVE

## 2017-09-22 NOTE — Progress Notes (Signed)
G2P1001 6972w2d Estimated Date of Delivery: 12/13/17  Weight 122 lb 6.4 oz (55.5 kg), last menstrual period 02/12/2017.   BP weight and urine results all reviewed and noted.  Please refer to the obstetrical flow sheet for the fundal height and fetal heart rate documentation:  US 28+2 wks,cephalic,cx 3.1 cm,anterior pl gr 1,afi 17 cm,normal ovaries bilat,fhr 158 bpm,EFW 1310 g 52%    Patient reports good fetal movement, denies any bleeding and no rupture of membranes symptoms or regular contractions. Patient is without complaints. All questions were answered.  Orders Placed This Encounter  Procedures  . US OB Follow Up  . POCT urinalysis dipstick    Plan:  Continued routine obstetrical care, EFW 32,36 weeks PN2 today  Return in about 4 weeks (around 10/20/2017) for LROB, US:EFW.

## 2017-09-22 NOTE — Patient Instructions (Addendum)
[name] [name], I greatly value your feedback.  If you receive a survey following your visit with [place] today, we appreciate you taking the time to fill it out.  Thanks, Cathie Beams, CNM   Call the office [phone]) or go to [facility] if:  You begin to have strong, frequent contractions  Your water breaks.  Sometimes it is a big gush of fluid, sometimes it is just a trickle that keeps getting your panties wet or running down your legs  You have vaginal bleeding.  It is normal to have a small amount of spotting if your cervix was checked.   You don't feel your baby moving like normal.  If you don't, get you something to eat and drink and lay down and focus on feeling your baby move.  You should feel at least 10 movements in 2 hours.  If you don't, you should call the office or go to [facility] [place].    Tdap Vaccine  It is recommended that you get the Tdap vaccine during the third trimester of EACH pregnancy to help protect your baby from getting pertussis (whooping cough)  27-36 weeks is the BEST time to do this so that you can pass the protection on to your baby. During pregnancy is better than after pregnancy, but if you are unable to get it during pregnancy it will be offered at the hospital.   You can get this vaccine at the health department or your family doctor  Everyone who will be around your baby should also be up-to-date on their vaccines. Adults (who are not pregnant) only need 1 dose of Tdap during adulthood.   Third Trimester of Pregnancy The third trimester is from week 29 through week 42, months 7 through 9. The third trimester is a time when the fetus is growing rapidly. At the end of the ninth month, the fetus is about 20 inches in length and weighs 6-10 pounds.  BODY CHANGES Your body goes through many changes during pregnancy. The changes vary from woman to woman.   Your weight will continue to increase. You can expect to gain 25-35 pounds (11-16  kg) by the end of the pregnancy.  You may begin to get stretch marks on your hips, abdomen, and breasts.  You may urinate more often because the fetus is moving lower into your pelvis and pressing on your bladder.  You may develop or continue to have heartburn as a result of your pregnancy.  You may develop constipation because certain hormones are causing the muscles that push waste through your intestines to slow down.  You may develop hemorrhoids or swollen, bulging veins (varicose veins).  You may have pelvic pain because of the weight gain and pregnancy hormones relaxing your joints between the bones in your pelvis. Backaches may result from overexertion of the muscles supporting your posture.  You may have changes in your hair. These can include thickening of your hair, rapid growth, and changes in texture. Some women also have hair loss during or after pregnancy, or hair that feels dry or thin. Your hair will most likely return to normal after your baby is born.  Your breasts will continue to grow and be tender. A yellow discharge may leak from your breasts called colostrum.  Your belly button may stick out.  You may feel short of breath because of your expanding uterus.  You may notice the fetus "dropping," or moving lower in your abdomen.  You may have a bloody mucus discharge.  This usually occurs a few days to a week before labor begins.  Your cervix becomes thin and soft (effaced) near your due date. WHAT TO EXPECT AT YOUR PRENATAL EXAMS  You will have prenatal exams every 2 weeks until week 36. Then, you will have weekly prenatal exams. During a routine prenatal visit:  You will be weighed to make sure you and the fetus are growing normally.  Your blood pressure is taken.  Your abdomen will be measured to track your baby's growth.  The fetal heartbeat will be listened to.  Any test results from the previous visit will be discussed.  You may have a cervical check  near your due date to see if you have effaced. At around 36 weeks, your caregiver will check your cervix. At the same time, your caregiver will also perform a test on the secretions of the vaginal tissue. This test is to determine if a type of bacteria, Group B streptococcus, is present. Your caregiver will explain this further. Your caregiver may ask you:  What your birth plan is.  How you are feeling.  If you are feeling the baby move.  If you have had any abnormal symptoms, such as leaking fluid, bleeding, severe headaches, or abdominal cramping.  If you have any questions. Other tests or screenings that may be performed during your third trimester include:  Blood tests that check for low iron levels (anemia).  Fetal testing to check the health, activity level, and growth of the fetus. Testing is done if you have certain medical conditions or if there are problems during the pregnancy. FALSE LABOR You may feel small, irregular contractions that eventually go away. These are called Braxton Hicks contractions, or false labor. Contractions may last for hours, days, or even weeks before true labor sets in. If contractions come at regular intervals, intensify, or become painful, it is best to be seen by your caregiver.  SIGNS OF LABOR   Menstrual-like cramps.  Contractions that are 5 minutes apart or less.  Contractions that start on the top of the uterus and spread down to the lower abdomen and back.  A sense of increased pelvic pressure or back pain.  A watery or bloody mucus discharge that comes from the vagina. If you have any of these signs before the 37th week of pregnancy, call your caregiver right away. You need to go to the hospital to get checked immediately. HOME CARE INSTRUCTIONS   Avoid all smoking, herbs, alcohol, and unprescribed drugs. These chemicals affect the formation and growth of the baby.  Follow your caregiver's instructions regarding medicine use. There are  medicines that are either safe or unsafe to take during pregnancy.  Exercise only as directed by your caregiver. Experiencing uterine cramps is a good sign to stop exercising.  Continue to eat regular, healthy meals.  Wear a good support bra for breast tenderness.  Do not use hot tubs, steam rooms, or saunas.  Wear your seat belt at all times when driving.  Avoid raw meat, uncooked cheese, cat litter boxes, and soil used by cats. These carry germs that can cause birth defects in the baby.  Take your prenatal vitamins.  Try taking a stool softener (if your caregiver approves) if you develop constipation. Eat more high-fiber foods, such as fresh vegetables or fruit and whole grains. Drink plenty of fluids to keep your urine clear or pale yellow.  Take warm sitz baths to soothe any pain or discomfort caused by hemorrhoids. Use hemorrhoid  cream if your caregiver approves.  If you develop varicose veins, wear support hose. Elevate your feet for 15 minutes, 3-4 times a day. Limit salt in your diet.  Avoid heavy lifting, wear low heal shoes, and practice good posture.  Rest a lot with your legs elevated if you have leg cramps or low back pain.  Visit your dentist if you have not gone during your pregnancy. Use a soft toothbrush to brush your teeth and be gentle when you floss.  A sexual relationship may be continued unless your caregiver directs you otherwise.  Do not travel far distances unless it is absolutely necessary and only with the approval of your caregiver.  Take prenatal classes to understand, practice, and ask questions about the labor and delivery.  Make a trial run to the hospital.  Pack your hospital bag.  Prepare the baby's nursery.  Continue to go to all your prenatal visits as directed by your caregiver. SEEK MEDICAL CARE IF:  You are unsure if you are in labor or if your water has broken.  You have dizziness.  You have mild pelvic cramps, pelvic pressure, or  nagging pain in your abdominal area.  You have persistent nausea, vomiting, or diarrhea.  You have a bad smelling vaginal discharge.  You have pain with urination. SEEK IMMEDIATE MEDICAL CARE IF:   You have a fever.  You are leaking fluid from your vagina.  You have spotting or bleeding from your vagina.  You have severe abdominal cramping or pain.  You have rapid weight loss or gain.  You have shortness of breath with chest pain.  You notice sudden or extreme swelling of your face, hands, ankles, feet, or legs.  You have not felt your baby move in over an hour.  You have severe headaches that do not go away with medicine.  You have vision changes. Document Released: 07/21/2001 Document Revised: 08/01/2013 Document Reviewed: 09/27/2012 [facility] Patient Information 2015 [place], [place]. This information is not intended to replace advice given to you by your health care provider. Make sure you discuss any questions you have with your health care provider.   Kinesiology taping for pregnancy:  Youtube has good vidoes of "how tos" for lower back, pelvic, hip pain; swelling of feet, etc

## 2017-09-22 NOTE — Progress Notes (Signed)
US 28+2 wks,cephalic,cx 3.1 cm,anterior pl gr 1,afi 17 cm,normal ovaries bilat,fhr 158 bpm,EFW 1310 g 52%

## 2017-09-23 LAB — ANTIBODY SCREEN

## 2017-09-23 LAB — CBC
HEMATOCRIT: 35.6 % (ref 34.0–46.6)
HEMOGLOBIN: 12.1 g/dL (ref 11.1–15.9)
MCH: 31.2 pg (ref 26.6–33.0)
MCHC: 34 g/dL (ref 31.5–35.7)
MCV: 92 fL (ref 79–97)
Platelets: 241 10*3/uL (ref 150–379)
RBC: 3.88 x10E6/uL (ref 3.77–5.28)
RDW: 13.6 % (ref 12.3–15.4)
WBC: 8.1 10*3/uL (ref 3.4–10.8)

## 2017-09-23 LAB — GLUCOSE TOLERANCE, 2 HOURS W/ 1HR
GLUCOSE, 1 HOUR: 88 mg/dL (ref 65–179)
Glucose, 2 hour: 67 mg/dL (ref 65–152)
Glucose, Fasting: 73 mg/dL (ref 65–91)

## 2017-09-23 LAB — RPR: RPR: NONREACTIVE

## 2017-09-23 LAB — AB SCR+ANTIBODY ID

## 2017-09-23 LAB — HIV ANTIBODY (ROUTINE TESTING W REFLEX): HIV Screen 4th Generation wRfx: NONREACTIVE

## 2017-10-20 ENCOUNTER — Other Ambulatory Visit: Payer: Medicaid Other

## 2017-10-20 ENCOUNTER — Encounter: Payer: Medicaid Other | Admitting: Advanced Practice Midwife

## 2017-10-25 ENCOUNTER — Encounter: Payer: Self-pay | Admitting: Women's Health

## 2017-10-25 ENCOUNTER — Ambulatory Visit (INDEPENDENT_AMBULATORY_CARE_PROVIDER_SITE_OTHER): Payer: Medicaid Other | Admitting: Women's Health

## 2017-10-25 ENCOUNTER — Ambulatory Visit (INDEPENDENT_AMBULATORY_CARE_PROVIDER_SITE_OTHER): Payer: Medicaid Other

## 2017-10-25 VITALS — BP 120/80 | HR 86 | Wt 127.0 lb

## 2017-10-25 DIAGNOSIS — Z3483 Encounter for supervision of other normal pregnancy, third trimester: Secondary | ICD-10-CM

## 2017-10-25 DIAGNOSIS — Z8759 Personal history of other complications of pregnancy, childbirth and the puerperium: Secondary | ICD-10-CM

## 2017-10-25 DIAGNOSIS — O9989 Other specified diseases and conditions complicating pregnancy, childbirth and the puerperium: Secondary | ICD-10-CM

## 2017-10-25 DIAGNOSIS — N898 Other specified noninflammatory disorders of vagina: Secondary | ICD-10-CM

## 2017-10-25 DIAGNOSIS — Z1389 Encounter for screening for other disorder: Secondary | ICD-10-CM

## 2017-10-25 DIAGNOSIS — Z331 Pregnant state, incidental: Secondary | ICD-10-CM

## 2017-10-25 DIAGNOSIS — O09293 Supervision of pregnancy with other poor reproductive or obstetric history, third trimester: Secondary | ICD-10-CM | POA: Diagnosis not present

## 2017-10-25 DIAGNOSIS — Z3A33 33 weeks gestation of pregnancy: Secondary | ICD-10-CM | POA: Diagnosis not present

## 2017-10-25 DIAGNOSIS — Z3403 Encounter for supervision of normal first pregnancy, third trimester: Secondary | ICD-10-CM

## 2017-10-25 DIAGNOSIS — N941 Unspecified dyspareunia: Secondary | ICD-10-CM

## 2017-10-25 DIAGNOSIS — R300 Dysuria: Secondary | ICD-10-CM

## 2017-10-25 LAB — POCT URINALYSIS DIPSTICK
GLUCOSE UA: NEGATIVE
Ketones, UA: NEGATIVE
LEUKOCYTES UA: NEGATIVE
NITRITE UA: NEGATIVE
PROTEIN UA: NEGATIVE
RBC UA: NEGATIVE

## 2017-10-25 LAB — POCT WET PREP (WET MOUNT)
Clue Cells Wet Prep Whiff POC: NEGATIVE
TRICHOMONAS WET PREP HPF POC: ABSENT

## 2017-10-25 NOTE — Progress Notes (Signed)
LOW-RISK PREGNANCY VISIT Patient name: [name] [name] Ilic MRN [id number]  Date of birth: 01/18/1996 Chief Complaint:   Routine Prenatal Visit (US today; burning with urination; burning with sex; sex is painful)  History of Present Illness:   [name] [name] Battiste is a 22 y.o. 702P1001 female at 4775w0d with an Estimated Date of Delivery: 12/13/17 being seen today for ongoing management of a low-risk pregnancy.  Today she reports occ dysuria, especially when doesn't drink water; burning w/ sex. Thinks she may have yeast infection. Contractions: Not present. Vag. Bleeding: None.  Movement: Present. denies leaking of fluid. Review of Systems:   Pertinent items are noted in HPI Denies abnormal vaginal discharge w/ itching/odor/irritation, headaches, visual changes, shortness of breath, chest pain, abdominal pain, severe nausea/vomiting, or problems with urination or bowel movements unless otherwise stated above. Pertinent History Reviewed:  Reviewed past medical,surgical, social, obstetrical and family history.  Reviewed problem list, medications and allergies. Physical Assessment:   Vitals:   10/25/17 1406  BP: 120/80  Pulse: 86  Weight: 127 lb (57.6 kg)  Body mass index is 21.13 kg/m.        Physical Examination:   General appearance: Well appearing, and in no distress  Mental status: Alert, oriented to person, place, and time  Skin: Warm & dry  Cardiovascular: Normal heart rate noted  Respiratory: Normal respiratory effort, no distress  Abdomen: Soft, gravid, nontender  Pelvic: cx visually closed, small amt yellow slightly malodorous d/c         Extremities: Edema: None  Fetal Status:     Movement: Present    US 33 wks,cephalic,fhr 144 bpm,cx 3.7 cm,anterior pl gr 1,AFI 13 cm,normal ovaries bilat,EFW 2214 g 57%  Results for orders placed or performed in visit on 10/25/17 (from the past 24 hour(s))  POCT urinalysis dipstick   Collection Time: 10/25/17  2:08 PM  Result Value Ref Range    Color, UA     Clarity, UA     Glucose, UA neg    Bilirubin, UA     Ketones, UA neg    Spec Grav, UA  1.010 - 1.025   Blood, UA neg    pH, UA  5.0 - 8.0   Protein, UA neg    Urobilinogen, UA  0.2 or 1.0 E.U./dL   Nitrite, UA neg    Leukocytes, UA Negative Negative   Appearance     Odor    POCT Wet Prep Mellody Drown([place]et [place])   Collection Time: 10/25/17  2:39 PM  Result Value Ref Range   Source Wet Prep POC vaginal    WBC, Wet Prep HPF POC few    Bacteria Wet Prep HPF POC Few Few   BACTERIA WET PREP MORPHOLOGY POC     Clue Cells Wet Prep HPF POC None None   Clue Cells Wet Prep Whiff POC Negative Whiff    Yeast Wet Prep HPF POC None    KOH Wet Prep POC     Trichomonas Wet Prep HPF POC Absent Absent    Assessment & Plan:  1) Low-risk pregnancy G2P1001 at 975w0d with an Estimated Date of Delivery: 12/13/17   2) Dysuria/pain w/ sex, will send urine cx, gc/ct. Wet prep neg. Increase water intake, decrease sodas  3) H/O IUGR> efw normal today   Meds: No orders of the defined types were placed in this encounter.  Labs/procedures today: wet prep, gc/ct, urine cx  Plan:  Continue routine obstetrical care   Reviewed: Preterm labor symptoms and general obstetric precautions  including but not limited to vaginal bleeding, contractions, leaking of fluid and fetal movement were reviewed in detail with the patient.  All questions were answered  Follow-up: Return in about 2 weeks (around 11/08/2017) for LROB.  Orders Placed This Encounter  Procedures  . Urine Culture  . GC/Chlamydia Probe Amp  . POCT urinalysis dipstick  . POCT Wet Prep [facility])   Cheral Marker CNM, [facility] 10/25/2017 2:40 PM

## 2017-10-25 NOTE — Patient Instructions (Signed)
[name] [name], I greatly value your feedback.  If you receive a survey following your visit with us today, we appreciate you taking the time to fill it out.  Thanks, [name] [name], CNM, WHNP-BC   Call the office [phone]) or go to [facility] if:  You begin to have strong, frequent contractions  Your water breaks.  Sometimes it is a big gush of fluid, sometimes it is just a trickle that keeps getting your panties wet or running down your legs  You have vaginal bleeding.  It is normal to have a small amount of spotting if your cervix was checked.   You don't feel your baby moving like normal.  If you don't, get you something to eat and drink and lay down and focus on feeling your baby move.  You should feel at least 10 movements in 2 hours.  If you don't, you should call the office or go to [facility].    Tdap Vaccine  It is recommended that you get the Tdap vaccine during the third trimester of EACH pregnancy to help protect your baby from getting pertussis (whooping cough)  27-36 weeks is the BEST time to do this so that you can pass the protection on to your baby. During pregnancy is better than after pregnancy, but if you are unable to get it during pregnancy it will be offered at the hospital.   You can get this vaccine at the health department or your family doctor  Everyone who will be around your baby should also be up-to-date on their vaccines. Adults (who are not pregnant) only need 1 dose of Tdap during adulthood.   Third Trimester of Pregnancy The third trimester is from week 29 through week 42, months 7 through 9. The third trimester is a time when the fetus is growing rapidly. At the end of the ninth month, the fetus is about 20 inches in length and weighs 6-10 pounds.  BODY CHANGES Your body goes through many changes during pregnancy. The changes vary from woman to woman.   Your weight will continue to increase. You can expect to gain 25-35 pounds (11-16 kg)  by the end of the pregnancy.  You may begin to get stretch marks on your hips, abdomen, and breasts.  You may urinate more often because the fetus is moving lower into your pelvis and pressing on your bladder.  You may develop or continue to have heartburn as a result of your pregnancy.  You may develop constipation because certain hormones are causing the muscles that push waste through your intestines to slow down.  You may develop hemorrhoids or swollen, bulging veins (varicose veins).  You may have pelvic pain because of the weight gain and pregnancy hormones relaxing your joints between the bones in your pelvis. Backaches may result from overexertion of the muscles supporting your posture.  You may have changes in your hair. These can include thickening of your hair, rapid growth, and changes in texture. Some women also have hair loss during or after pregnancy, or hair that feels dry or thin. Your hair will most likely return to normal after your baby is born.  Your breasts will continue to grow and be tender. A yellow discharge may leak from your breasts called colostrum.  Your belly button may stick out.  You may feel short of breath because of your expanding uterus.  You may notice the fetus "dropping," or moving lower in your abdomen.  You may have a bloody mucus  discharge. This usually occurs a few days to a week before labor begins.  Your cervix becomes thin and soft (effaced) near your due date. WHAT TO EXPECT AT YOUR PRENATAL EXAMS  You will have prenatal exams every 2 weeks until week 36. Then, you will have weekly prenatal exams. During a routine prenatal visit:  You will be weighed to make sure you and the fetus are growing normally.  Your blood pressure is taken.  Your abdomen will be measured to track your baby's growth.  The fetal heartbeat will be listened to.  Any test results from the previous visit will be discussed.  You may have a cervical check near  your due date to see if you have effaced. At around 36 weeks, your caregiver will check your cervix. At the same time, your caregiver will also perform a test on the secretions of the vaginal tissue. This test is to determine if a type of bacteria, Group B streptococcus, is present. Your caregiver will explain this further. Your caregiver may ask you:  What your birth plan is.  How you are feeling.  If you are feeling the baby move.  If you have had any abnormal symptoms, such as leaking fluid, bleeding, severe headaches, or abdominal cramping.  If you have any questions. Other tests or screenings that may be performed during your third trimester include:  Blood tests that check for low iron levels (anemia).  Fetal testing to check the health, activity level, and growth of the fetus. Testing is done if you have certain medical conditions or if there are problems during the pregnancy. FALSE LABOR You may feel small, irregular contractions that eventually go away. These are called Braxton Hicks contractions, or false labor. Contractions may last for hours, days, or even weeks before true labor sets in. If contractions come at regular intervals, intensify, or become painful, it is best to be seen by your caregiver.  SIGNS OF LABOR   Menstrual-like cramps.  Contractions that are 5 minutes apart or less.  Contractions that start on the top of the uterus and spread down to the lower abdomen and back.  A sense of increased pelvic pressure or back pain.  A watery or bloody mucus discharge that comes from the vagina. If you have any of these signs before the 37th week of pregnancy, call your caregiver right away. You need to go to the hospital to get checked immediately. HOME CARE INSTRUCTIONS   Avoid all smoking, herbs, alcohol, and unprescribed drugs. These chemicals affect the formation and growth of the baby.  Follow your caregiver's instructions regarding medicine use. There are  medicines that are either safe or unsafe to take during pregnancy.  Exercise only as directed by your caregiver. Experiencing uterine cramps is a good sign to stop exercising.  Continue to eat regular, healthy meals.  Wear a good support bra for breast tenderness.  Do not use hot tubs, steam rooms, or saunas.  Wear your seat belt at all times when driving.  Avoid raw meat, uncooked cheese, cat litter boxes, and soil used by cats. These carry germs that can cause birth defects in the baby.  Take your prenatal vitamins.  Try taking a stool softener (if your caregiver approves) if you develop constipation. Eat more high-fiber foods, such as fresh vegetables or fruit and whole grains. Drink plenty of fluids to keep your urine clear or pale yellow.  Take warm sitz baths to soothe any pain or discomfort caused by hemorrhoids. Use  hemorrhoid cream if your caregiver approves.  If you develop varicose veins, wear support hose. Elevate your feet for 15 minutes, 3-4 times a day. Limit salt in your diet.  Avoid heavy lifting, wear low heal shoes, and practice good posture.  Rest a lot with your legs elevated if you have leg cramps or low back pain.  Visit your dentist if you have not gone during your pregnancy. Use a soft toothbrush to brush your teeth and be gentle when you floss.  A sexual relationship may be continued unless your caregiver directs you otherwise.  Do not travel far distances unless it is absolutely necessary and only with the approval of your caregiver.  Take prenatal classes to understand, practice, and ask questions about the labor and delivery.  Make a trial run to the hospital.  Pack your hospital bag.  Prepare the baby's nursery.  Continue to go to all your prenatal visits as directed by your caregiver. SEEK MEDICAL CARE IF:  You are unsure if you are in labor or if your water has broken.  You have dizziness.  You have mild pelvic cramps, pelvic pressure, or  nagging pain in your abdominal area.  You have persistent nausea, vomiting, or diarrhea.  You have a bad smelling vaginal discharge.  You have pain with urination. SEEK IMMEDIATE MEDICAL CARE IF:   You have a fever.  You are leaking fluid from your vagina.  You have spotting or bleeding from your vagina.  You have severe abdominal cramping or pain.  You have rapid weight loss or gain.  You have shortness of breath with chest pain.  You notice sudden or extreme swelling of your face, hands, ankles, feet, or legs.  You have not felt your baby move in over an hour.  You have severe headaches that do not go away with medicine.  You have vision changes. Document Released: 07/21/2001 Document Revised: 08/01/2013 Document Reviewed: 09/27/2012 [facility] Patient Information 2015 [place]. This information is not intended to replace advice given to you by your health care provider. Make sure you discuss any questions you have with your health care provider.

## 2017-10-25 NOTE — Progress Notes (Signed)
US 33 wks,cephalic,fhr 144 bpm,cx 3.7 cm,anterior pl gr 1,AFI 13 cm,normal ovaries bilat,EFW 2214 g 57%

## 2017-10-27 LAB — URINE CULTURE: Organism ID, Bacteria: NO GROWTH

## 2017-10-27 LAB — GC/CHLAMYDIA PROBE AMP
Chlamydia trachomatis, NAA: NEGATIVE
Neisseria gonorrhoeae by PCR: NEGATIVE

## 2017-11-08 ENCOUNTER — Encounter: Payer: Medicaid Other | Admitting: Women's Health

## 2017-11-10 ENCOUNTER — Encounter: Payer: Self-pay | Admitting: Women's Health

## 2017-11-10 ENCOUNTER — Ambulatory Visit (INDEPENDENT_AMBULATORY_CARE_PROVIDER_SITE_OTHER): Payer: Medicaid Other | Admitting: Women's Health

## 2017-11-10 VITALS — BP 112/84 | HR 86 | Wt 130.0 lb

## 2017-11-10 DIAGNOSIS — Z1389 Encounter for screening for other disorder: Secondary | ICD-10-CM

## 2017-11-10 DIAGNOSIS — K219 Gastro-esophageal reflux disease without esophagitis: Secondary | ICD-10-CM

## 2017-11-10 DIAGNOSIS — Z3483 Encounter for supervision of other normal pregnancy, third trimester: Secondary | ICD-10-CM

## 2017-11-10 DIAGNOSIS — Z3A35 35 weeks gestation of pregnancy: Secondary | ICD-10-CM

## 2017-11-10 DIAGNOSIS — Z331 Pregnant state, incidental: Secondary | ICD-10-CM

## 2017-11-10 DIAGNOSIS — O26843 Uterine size-date discrepancy, third trimester: Secondary | ICD-10-CM

## 2017-11-10 DIAGNOSIS — O99613 Diseases of the digestive system complicating pregnancy, third trimester: Secondary | ICD-10-CM

## 2017-11-10 DIAGNOSIS — Z8759 Personal history of other complications of pregnancy, childbirth and the puerperium: Secondary | ICD-10-CM

## 2017-11-10 DIAGNOSIS — O09293 Supervision of pregnancy with other poor reproductive or obstetric history, third trimester: Secondary | ICD-10-CM

## 2017-11-10 LAB — POCT URINALYSIS DIPSTICK
GLUCOSE UA: NEGATIVE
Ketones, UA: NEGATIVE
Nitrite, UA: NEGATIVE
Protein, UA: NEGATIVE

## 2017-11-10 MED ORDER — PANTOPRAZOLE SODIUM 20 MG PO TBEC: 20.0000 mg | DELAYED_RELEASE_TABLET | Freq: Every day | ORAL | 3 refills | Status: DC

## 2017-11-10 NOTE — Patient Instructions (Addendum)
[name] [name], I greatly value your feedback.  If you receive a survey following your visit with us today, we appreciate you taking the time to fill it out.  Thanks, Joellyn HaffKim Antwone Capozzoli, CNM, WHNP-BC  aquaphor or eucerin for dry skin patches   Call the office [phone]) or go to [facility] if:  You begin to have strong, frequent contractions  Your water breaks.  Sometimes it is a big gush of fluid, sometimes it is just a trickle that keeps getting your panties wet or running down your legs  You have vaginal bleeding.  It is normal to have a small amount of spotting if your cervix was checked.   You don't feel your baby moving like normal.  If you don't, get you something to eat and drink and lay down and focus on feeling your baby move.  You should feel at least 10 movements in 2 hours.  If you don't, you should call the office or go to [facility].    Tdap Vaccine  It is recommended that you get the Tdap vaccine during the third trimester of EACH pregnancy to help protect your baby from getting pertussis (whooping cough)  27-36 weeks is the BEST time to do this so that you can pass the protection on to your baby. During pregnancy is better than after pregnancy, but if you are unable to get it during pregnancy it will be offered at the hospital.   You can get this vaccine at the health department or your family doctor  Everyone who will be around your baby should also be up-to-date on their vaccines. Adults (who are not pregnant) only need 1 dose of Tdap during adulthood.   Third Trimester of Pregnancy The third trimester is from week 29 through week 42, months 7 through 9. The third trimester is a time when the fetus is growing rapidly. At the end of the ninth month, the fetus is about 20 inches in length and weighs 6-10 pounds.  BODY CHANGES Your body goes through many changes during pregnancy. The changes vary from woman to woman.   Your weight will continue to increase. You  can expect to gain 25-35 pounds (11-16 kg) by the end of the pregnancy.  You may begin to get stretch marks on your hips, abdomen, and breasts.  You may urinate more often because the fetus is moving lower into your pelvis and pressing on your bladder.  You may develop or continue to have heartburn as a result of your pregnancy.  You may develop constipation because certain hormones are causing the muscles that push waste through your intestines to slow down.  You may develop hemorrhoids or swollen, bulging veins (varicose veins).  You may have pelvic pain because of the weight gain and pregnancy hormones relaxing your joints between the bones in your pelvis. Backaches may result from overexertion of the muscles supporting your posture.  You may have changes in your hair. These can include thickening of your hair, rapid growth, and changes in texture. Some women also have hair loss during or after pregnancy, or hair that feels dry or thin. Your hair will most likely return to normal after your baby is born.  Your breasts will continue to grow and be tender. A yellow discharge may leak from your breasts called colostrum.  Your belly button may stick out.  You may feel short of breath because of your expanding uterus.  You may notice the fetus "dropping," or moving lower in your  abdomen.  You may have a bloody mucus discharge. This usually occurs a few days to a week before labor begins.  Your cervix becomes thin and soft (effaced) near your due date. WHAT TO EXPECT AT YOUR PRENATAL EXAMS  You will have prenatal exams every 2 weeks until week 36. Then, you will have weekly prenatal exams. During a routine prenatal visit:  You will be weighed to make sure you and the fetus are growing normally.  Your blood pressure is taken.  Your abdomen will be measured to track your baby's growth.  The fetal heartbeat will be listened to.  Any test results from the previous visit will be  discussed.  You may have a cervical check near your due date to see if you have effaced. At around 36 weeks, your caregiver will check your cervix. At the same time, your caregiver will also perform a test on the secretions of the vaginal tissue. This test is to determine if a type of bacteria, Group B streptococcus, is present. Your caregiver will explain this further. Your caregiver may ask you:  What your birth plan is.  How you are feeling.  If you are feeling the baby move.  If you have had any abnormal symptoms, such as leaking fluid, bleeding, severe headaches, or abdominal cramping.  If you have any questions. Other tests or screenings that may be performed during your third trimester include:  Blood tests that check for low iron levels (anemia).  Fetal testing to check the health, activity level, and growth of the fetus. Testing is done if you have certain medical conditions or if there are problems during the pregnancy. FALSE LABOR You may feel small, irregular contractions that eventually go away. These are called Braxton Hicks contractions, or false labor. Contractions may last for hours, days, or even weeks before true labor sets in. If contractions come at regular intervals, intensify, or become painful, it is best to be seen by your caregiver.  SIGNS OF LABOR   Menstrual-like cramps.  Contractions that are 5 minutes apart or less.  Contractions that start on the top of the uterus and spread down to the lower abdomen and back.  A sense of increased pelvic pressure or back pain.  A watery or bloody mucus discharge that comes from the vagina. If you have any of these signs before the 37th week of pregnancy, call your caregiver right away. You need to go to the hospital to get checked immediately. HOME CARE INSTRUCTIONS   Avoid all smoking, herbs, alcohol, and unprescribed drugs. These chemicals affect the formation and growth of the baby.  Follow your caregiver's  instructions regarding medicine use. There are medicines that are either safe or unsafe to take during pregnancy.  Exercise only as directed by your caregiver. Experiencing uterine cramps is a good sign to stop exercising.  Continue to eat regular, healthy meals.  Wear a good support bra for breast tenderness.  Do not use hot tubs, steam rooms, or saunas.  Wear your seat belt at all times when driving.  Avoid raw meat, uncooked cheese, cat litter boxes, and soil used by cats. These carry germs that can cause birth defects in the baby.  Take your prenatal vitamins.  Try taking a stool softener (if your caregiver approves) if you develop constipation. Eat more high-fiber foods, such as fresh vegetables or fruit and whole grains. Drink plenty of fluids to keep your urine clear or pale yellow.  Take warm sitz baths to soothe  any pain or discomfort caused by hemorrhoids. Use hemorrhoid cream if your caregiver approves.  If you develop varicose veins, wear support hose. Elevate your feet for 15 minutes, 3-4 times a day. Limit salt in your diet.  Avoid heavy lifting, wear low heal shoes, and practice good posture.  Rest a lot with your legs elevated if you have leg cramps or low back pain.  Visit your dentist if you have not gone during your pregnancy. Use a soft toothbrush to brush your teeth and be gentle when you floss.  A sexual relationship may be continued unless your caregiver directs you otherwise.  Do not travel far distances unless it is absolutely necessary and only with the approval of your caregiver.  Take prenatal classes to understand, practice, and ask questions about the labor and delivery.  Make a trial run to the hospital.  Pack your hospital bag.  Prepare the baby's nursery.  Continue to go to all your prenatal visits as directed by your caregiver. SEEK MEDICAL CARE IF:  You are unsure if you are in labor or if your water has broken.  You have  dizziness.  You have mild pelvic cramps, pelvic pressure, or nagging pain in your abdominal area.  You have persistent nausea, vomiting, or diarrhea.  You have a bad smelling vaginal discharge.  You have pain with urination. SEEK IMMEDIATE MEDICAL CARE IF:   You have a fever.  You are leaking fluid from your vagina.  You have spotting or bleeding from your vagina.  You have severe abdominal cramping or pain.  You have rapid weight loss or gain.  You have shortness of breath with chest pain.  You notice sudden or extreme swelling of your face, hands, ankles, feet, or legs.  You have not felt your baby move in over an hour.  You have severe headaches that do not go away with medicine.  You have vision changes. Document Released: 07/21/2001 Document Revised: 08/01/2013 Document Reviewed: 09/27/2012 [facility] Patient Information 2015 [place], [place]. This information is not intended to replace advice given to you by your health care provider. Make sure you discuss any questions you have with your health care provider.

## 2017-11-10 NOTE — Progress Notes (Signed)
   LOW-RISK PREGNANCY VISIT Patient name: [name] [name] MRN [id number]  Date of birth: 01/25/1996 Chief Complaint:   Routine Prenatal Visit (dry patches on face; heartburn; vomiting off and on)  History of Present Illness:   [name] HitchOlivia Picha is a 22 y.o. 252P1001 female at 701w2d with an Estimated Date of Delivery: 12/13/17 being seen today for ongoing management of a low-risk pregnancy.  Today she reports dry patches on face, hasn't tried anything. Reflux which makes her vomit, vomits TUMS back up. . Contractions: Not present. Vag. Bleeding: None.  Movement: Present. denies leaking of fluid. Review of Systems:   Pertinent items are noted in HPI Denies abnormal vaginal discharge w/ itching/odor/irritation, headaches, visual changes, shortness of breath, chest pain, abdominal pain, severe nausea/vomiting, or problems with urination or bowel movements unless otherwise stated above. Pertinent History Reviewed:  Reviewed past medical,surgical, social, obstetrical and family history.  Reviewed problem list, medications and allergies. Physical Assessment:   Vitals:   11/10/17 1538  BP: 112/84  Pulse: 86  Weight: 130 lb (59 kg)  Body mass index is 21.63 kg/m.        Physical Examination:   General appearance: Well appearing, and in no distress  Mental status: Alert, oriented to person, place, and time  Skin: Warm & dry  Cardiovascular: Normal heart rate noted  Respiratory: Normal respiratory effort, no distress  Abdomen: Soft, gravid, nontender  Pelvic: Cervical exam deferred         Extremities: Edema: Trace  Fetal Status: Fetal Heart Rate (bpm): 138 Fundal Height: 30 cm Movement: Present    Results for orders placed or performed in visit on 11/10/17 (from the past 24 hour(s))  POCT urinalysis dipstick   Collection Time: 11/10/17  3:39 PM  Result Value Ref Range   Color, UA     Clarity, UA     Glucose, UA neg    Bilirubin, UA     Ketones, UA neg    Spec Grav, UA  1.010 -  1.025   Blood, UA 1+    pH, UA  5.0 - 8.0   Protein, UA neg    Urobilinogen, UA  0.2 or 1.0 E.U./dL   Nitrite, UA neg    Leukocytes, UA Moderate (2+) (A) Negative   Appearance     Odor      Assessment & Plan:  1) Low-risk pregnancy G2P1001 at 721w2d with an Estimated Date of Delivery: 12/13/17   2) Size < dates w/ H/O IUGR, last growth 57% @ 33wks  3) Dry patches on face> try eucerine or aquaphor  4) Reflux w/ vomting> rx protonix   Meds:  Meds ordered this encounter  Medications  . pantoprazole (PROTONIX) 20 MG tablet    Sig: Take 1 tablet (20 mg total) by mouth daily.    Dispense:  30 tablet    Refill:  3    Order Specific Question:   Supervising Provider    Answer:   Lazaro ArmsEURE, LUTHER H [[id number]]   Labs/procedures today: none  Plan:  Continue routine obstetrical care   Reviewed: Preterm labor symptoms and general obstetric precautions including but not limited to vaginal bleeding, contractions, leaking of fluid and fetal movement were reviewed in detail with the patient.  All questions were answered  Follow-up: Return in about 1 week (around 11/17/2017) for LROB, US:EFW.  Orders Placed This Encounter  Procedures  . US OB Follow Up  . POCT urinalysis dipstick   Cheral MarkerKimberly R Georgeanna Radziewicz CNM, [facility]NP-BC 11/10/2017 4:02 PM

## 2017-11-17 ENCOUNTER — Ambulatory Visit (INDEPENDENT_AMBULATORY_CARE_PROVIDER_SITE_OTHER): Payer: Medicaid Other | Admitting: Advanced Practice Midwife

## 2017-11-17 ENCOUNTER — Ambulatory Visit (INDEPENDENT_AMBULATORY_CARE_PROVIDER_SITE_OTHER): Payer: Medicaid Other

## 2017-11-17 VITALS — BP 118/66 | HR 74 | Wt 131.0 lb

## 2017-11-17 DIAGNOSIS — Z1389 Encounter for screening for other disorder: Secondary | ICD-10-CM

## 2017-11-17 DIAGNOSIS — Z3483 Encounter for supervision of other normal pregnancy, third trimester: Secondary | ICD-10-CM

## 2017-11-17 DIAGNOSIS — O26843 Uterine size-date discrepancy, third trimester: Secondary | ICD-10-CM

## 2017-11-17 DIAGNOSIS — Z3A36 36 weeks gestation of pregnancy: Secondary | ICD-10-CM

## 2017-11-17 DIAGNOSIS — O09293 Supervision of pregnancy with other poor reproductive or obstetric history, third trimester: Secondary | ICD-10-CM

## 2017-11-17 DIAGNOSIS — Z8759 Personal history of other complications of pregnancy, childbirth and the puerperium: Secondary | ICD-10-CM

## 2017-11-17 DIAGNOSIS — Z331 Pregnant state, incidental: Secondary | ICD-10-CM

## 2017-11-17 DIAGNOSIS — Z3403 Encounter for supervision of normal first pregnancy, third trimester: Secondary | ICD-10-CM

## 2017-11-17 LAB — POCT URINALYSIS DIPSTICK
Blood, UA: NEGATIVE
GLUCOSE UA: NEGATIVE
Ketones, UA: NEGATIVE
Nitrite, UA: NEGATIVE
Protein, UA: NEGATIVE

## 2017-11-17 NOTE — Patient Instructions (Signed)

## 2017-11-17 NOTE — Progress Notes (Addendum)
  G2P1001 7076w2d Estimated Date of Delivery: 12/13/17  Blood pressure 118/66, pulse 74, weight 131 lb (59.4 kg), last menstrual period 02/12/2017.   BP weight and urine results all reviewed and noted. Patient reports good fetal movement, denies any bleeding and no rupture of membranes symptoms or regular contractions. Patient is without complaints. All questions were answered.   Physical Assessment:   Vitals:   11/17/17 1436  BP: 118/66  Pulse: 74  Weight: 131 lb (59.4 kg)  Body mass index is 21.8 kg/m.        Physical Examination:   General appearance: Well appearing, and in no distress  Mental status: Alert, oriented to person, place, and time  Skin: Warm & dry  Cardiovascular: Normal heart rate noted  Respiratory: Normal respiratory effort, no distress  Abdomen: Soft, gravid, nontender  Pelvic: Cervical exam deferred         Extremities: Edema: Trace  Fetal Status:     Movement: Present    Results for orders placed or performed in visit on 11/17/17 (from the past 24 hour(s))  POCT Urinalysis Dipstick   Collection Time: 11/17/17  2:40 PM  Result Value Ref Range   Color, UA     Clarity, UA     Glucose, UA neg    Bilirubin, UA     Ketones, UA neg    Spec Grav, UA  1.010 - 1.025   Blood, UA neg    pH, UA  5.0 - 8.0   Protein, UA neg    Urobilinogen, UA  0.2 or 1.0 E.U./dL   Nitrite, UA neg    Leukocytes, UA Trace (A) Negative   Appearance     Odor      US 36+2 wks,cephalic,anterior pl gr 3,fhr 161121 bpm,afi 16 cm,bilat adnexa's wnl,EFW 3007 g 64%   Orders Placed This Encounter  Procedures  . POCT Urinalysis Dipstick    Plan:  Continued routine obstetrical care,   Return in about 1 week (around 11/24/2017) for LROB.

## 2017-11-17 NOTE — Progress Notes (Signed)
US 36+2 wks,cephalic,anterior pl gr 3,fhr 161121 bpm,afi 16 cm,bilat adnexa's wnl,EFW 3007 g 64%

## 2017-11-22 ENCOUNTER — Telehealth: Payer: Self-pay | Admitting: *Deleted

## 2017-11-22 NOTE — Telephone Encounter (Signed)
Patient states she is having irregular contractions that are uncomfortable but nothing consistent.  No leaking or bleeding noted.  Informed patient that if contractions become consistent in frequency and in discomfort, to call us or go [facility]  Informed she could have painful contractions days or weeks before the baby is born and unless her cervix is changing, she was not in labor.  States she is having back pain too.  Discussed different positions to try to alleviate the pain.  Verbalized understanding with no further questions.

## 2017-11-25 ENCOUNTER — Ambulatory Visit (INDEPENDENT_AMBULATORY_CARE_PROVIDER_SITE_OTHER): Payer: Medicaid Other | Admitting: Advanced Practice Midwife

## 2017-11-25 ENCOUNTER — Encounter: Payer: Self-pay | Admitting: Advanced Practice Midwife

## 2017-11-25 VITALS — BP 110/70 | HR 90 | Wt 123.0 lb

## 2017-11-25 DIAGNOSIS — Z3A37 37 weeks gestation of pregnancy: Secondary | ICD-10-CM

## 2017-11-25 DIAGNOSIS — Z1389 Encounter for screening for other disorder: Secondary | ICD-10-CM

## 2017-11-25 DIAGNOSIS — Z3483 Encounter for supervision of other normal pregnancy, third trimester: Secondary | ICD-10-CM

## 2017-11-25 DIAGNOSIS — Z331 Pregnant state, incidental: Secondary | ICD-10-CM

## 2017-11-25 DIAGNOSIS — O9989 Other specified diseases and conditions complicating pregnancy, childbirth and the puerperium: Secondary | ICD-10-CM

## 2017-11-25 DIAGNOSIS — R51 Headache: Secondary | ICD-10-CM

## 2017-11-25 DIAGNOSIS — R800 Isolated proteinuria: Secondary | ICD-10-CM

## 2017-11-25 LAB — POCT URINALYSIS DIPSTICK
Glucose, UA: NEGATIVE
NITRITE UA: NEGATIVE

## 2017-11-25 MED ORDER — ONDANSETRON 4 MG PO TBDP: 4.0000 mg | ORAL_TABLET | Freq: Four times a day (QID) | ORAL | 2 refills | Status: DC | PRN

## 2017-11-25 NOTE — Patient Instructions (Signed)

## 2017-11-25 NOTE — Progress Notes (Signed)
  G2P1001 2768w3d Estimated Date of Delivery: 12/13/17  Blood pressure 110/70, pulse 90, weight 123 lb (55.8 kg), last menstrual period 02/12/2017.   BP weight and urine results all reviewed and noted.  Please refer to the obstetrical flow sheet for the fundal height and fetal heart rate documentation:  Patient reports good fetal movement, denies any bleeding and no rupture of membranes symptoms or regular contractions. Patient had a GI bug for a few days.  Now also has had a HA in back of head.  Doesn't want muscle relaxers.   All questions were answered. Try ice/massage.   Rx zofran prn nausea.    Physical Assessment:   Vitals:   11/25/17 1358  BP: 110/70  Pulse: 90  Weight: 123 lb (55.8 kg)  Body mass index is 20.47 kg/m.        Physical Examination:   General appearance: Well appearing, and in no distress  Mental status: Alert, oriented to person, place, and timed  Skin: Warm & dry  Cardiovascular: Normal heart rate noted  Respiratory: Normal respiratory effort, no distress  Abdomen: Soft, gravid, nontender  Pelvic: Cervical exam performed  Dilation: 2 Effacement (%): 50 Station: -2  Extremities: Edema: Trace  Fetal Status: Fetal Heart Rate (bpm): 128 Fundal Height: 34 cm Movement: Present Presentation: Vertex  Results for orders placed or performed in visit on 11/25/17 (from the past 24 hour(s))  POCT urinalysis dipstick   Collection Time: 11/25/17  1:59 PM  Result Value Ref Range   Color, UA     Clarity, UA     Glucose, UA neg    Bilirubin, UA     Ketones, UA 2+    Spec Grav, UA  1.010 - 1.025   Blood, UA trace    pH, UA  5.0 - 8.0   Protein, UA 1+    Urobilinogen, UA  0.2 or 1.0 E.U./dL   Nitrite, UA neg    Leukocytes, UA Trace (A) Negative   Appearance     Odor       Orders Placed This Encounter  Procedures  . GC/Chlamydia Probe Amp  . Strep Gp B NAA  . Urine Culture  . POCT urinalysis dipstick    Plan:  Continued routine obstetrical care,   Return  in about 1 week (around 12/02/2017) for LROB.

## 2017-11-27 LAB — URINE CULTURE

## 2017-11-27 LAB — GC/CHLAMYDIA PROBE AMP
Chlamydia trachomatis, NAA: NEGATIVE
NEISSERIA GONORRHOEAE BY PCR: NEGATIVE

## 2017-11-27 LAB — STREP GP B NAA: STREP GROUP B AG: NEGATIVE

## 2017-12-02 ENCOUNTER — Encounter: Payer: Self-pay | Admitting: Women's Health

## 2017-12-02 ENCOUNTER — Ambulatory Visit (INDEPENDENT_AMBULATORY_CARE_PROVIDER_SITE_OTHER): Payer: Medicaid Other | Admitting: Women's Health

## 2017-12-02 VITALS — BP 100/60 | HR 99 | Wt 125.0 lb

## 2017-12-02 DIAGNOSIS — O26843 Uterine size-date discrepancy, third trimester: Secondary | ICD-10-CM

## 2017-12-02 DIAGNOSIS — O9989 Other specified diseases and conditions complicating pregnancy, childbirth and the puerperium: Secondary | ICD-10-CM

## 2017-12-02 DIAGNOSIS — Z1389 Encounter for screening for other disorder: Secondary | ICD-10-CM

## 2017-12-02 DIAGNOSIS — Z3403 Encounter for supervision of normal first pregnancy, third trimester: Secondary | ICD-10-CM

## 2017-12-02 DIAGNOSIS — Z331 Pregnant state, incidental: Secondary | ICD-10-CM

## 2017-12-02 DIAGNOSIS — Z3A38 38 weeks gestation of pregnancy: Secondary | ICD-10-CM

## 2017-12-02 DIAGNOSIS — M5430 Sciatica, unspecified side: Secondary | ICD-10-CM

## 2017-12-02 LAB — POCT URINALYSIS DIPSTICK
Blood, UA: NEGATIVE
GLUCOSE UA: NEGATIVE
Ketones, UA: NEGATIVE
LEUKOCYTES UA: NEGATIVE
Nitrite, UA: NEGATIVE

## 2017-12-02 NOTE — Patient Instructions (Addendum)
[name] [name], I greatly value your feedback.  If you receive a survey following your visit with us today, we appreciate you taking the time to fill it out.  Thanks, Joellyn HaffKim Amma Crear, CNM, WHNP-BC   Call the office [phone]) or [facility] to [facility] if:  You begin to have strong, frequent contractions  Your water breaks.  Sometimes it is a big gush of fluid, sometimes it is just a trickle that keeps getting your panties wet or running down your legs  You have vaginal bleeding.  It is normal to have a small amount of spotting if your cervix was checked.   You don't feel your baby moving like normal.  If you don't, get you something to eat and drink and lay down and focus on feeling your baby move.  You should feel at least 10 movements in 2 hours.  If you don't, you should call the office or [facility] to [facility].     [facility] Cont[facility] Contractions of the uterus can occur throughout pregnancy, but they are not always a sign that you are in labor. You may have practice contractions called Braxton Hicks contractions. These false labor contractions are sometimes confused with true labor. What are Deberah PeltonBraxton Hicks contractions? Braxton Hicks contractions are tightening movements that occur in the muscles of the uterus before labor. Unlike true labor contractions, these contractions do not result in opening (dilation) and thinning of the cervix. Toward the end of pregnancy (32-34 weeks), Braxton Hicks contractions can happen more often and may become stronger. These contractions are sometimes difficult to tell apart from true labor because they can be very uncomfortable. You should not feel embarrassed if you go to the hospital with false labor. Sometimes, the only way to tell if you are in true labor is for your health care provider to look for changes in the cervix. The health care provider will do a physical exam and may monitor your contractions. If you are not in true labor, the exam  should show that your cervix is not dilating and your water has not broken. If there are other health problems associated with your pregnancy, it is completely safe for you to be sent home with false labor. You may continue to have Braxton Hicks contractions until you go into true labor. How to tell the difference between true labor and false labor True labor  Contractions last 30-70 seconds.  Contractions become very regular.  Discomfort is usually felt in the top of the uterus, and it spreads to the lower abdomen and low back.  Contractions do not go away with walking.  Contractions usually become more intense and increase in frequency.  The cervix dilates and gets thinner. False labor  Contractions are usually shorter and not as strong as true labor contractions.  Contractions are usually irregular.  Contractions are often felt in the front of the lower abdomen and in the groin.  Contractions may go away when you walk around or change positions while lying down.  Contractions get weaker and are shorter-lasting as time goes on.  The cervix usually does not dilate or become thin. Follow these instructions at home:  Take over-the-counter and prescription medicines only as told by your health care provider.  Keep up with your usual exercises and follow other instructions from your health care provider.  Eat and drink lightly if you think you are going into labor.  If Braxton Hicks contractions are making you uncomfortable: ? Change your position from lying down  or resting to walking, or change from walking to resting. ? Sit and rest in a tub of warm water. ? Drink enough fluid to keep your urine pale yellow. Dehydration may cause these contractions. ? Do slow and deep breathing several times an hour.  Keep all follow-up prenatal visits as told by your health care provider. This is important. Contact a health care provider if:  You have a fever.  You have continuous pain  in your abdomen. Get help right away if:  Your contractions become stronger, more regular, and closer together.  You have fluid leaking or gushing from your vagina.  You pass blood-tinged mucus (bloody show).  You have bleeding from your vagina.  You have low back pain that you never had before.  You feel your baby's head pushing down and causing pelvic pressure.  Your baby is not moving inside you as much as it used to. Summary  Contractions that occur before labor are called Braxton Hicks contractions, false labor, or practice contractions.  Braxton Hicks contractions are usually shorter, weaker, farther apart, and less regular than true labor contractions. True labor contractions usually become progressively stronger and regular and they become more frequent.  Manage discomfort from [facility] contractions by changing position, resting in a warm bath, drinking plenty of water, or practicing deep breathing. This information is not intended to replace advice given to you by your health care provider. Make sure you discuss any questions you have with your health care provider. Document Released: 12/10/2016 Document Revised: 12/10/2016 Document Reviewed: 12/10/2016 Elsevier Interactive Patient Education  2018 [employer].   Sciatica Rehab Ask your health care provider which exercises are safe for you. Do exercises exactly as told by your health care provider and adjust them as directed. It is normal to feel mild stretching, pulling, tightness, or discomfort as you do these exercises, but you should stop right away if you feel sudden pain or your pain gets worse.Do not begin these exercises until told by your health care provider. Stretching and range of motion exercises These exercises warm up your muscles and joints and improve the movement and flexibility of your hips and your back. These exercises also help to relieve pain, numbness, and tingling. Exercise A: Sciatic nerve  glide 1. Sit in a chair with your head facing down toward your chest. Place your hands behind your back. Let your shoulders slump forward. 2. Slowly straighten one of your knees while you tilt your head back as if you are looking toward the ceiling. Only straighten your leg as far as you can without making your symptoms worse. 3. Hold for __________ seconds. 4. Slowly return to the starting position. 5. Repeat with your other leg. Repeat __________ times. Complete this exercise __________ times a day. Exercise B: Knee to chest with hip adduction and internal rotation  1. Lie on your back on a firm surface with both legs straight. 2. Bend one of your knees and move it up toward your chest until you feel a gentle stretch in your lower back and buttock. Then, move your knee toward the shoulder that is on the opposite side from your leg. ? Hold your leg in this position by holding onto the front of your knee. 3. Hold for __________ seconds. 4. Slowly return to the starting position. 5. Repeat with your other leg. Repeat __________ times. Complete this exercise __________ times a day. Exercise C: Prone extension on elbows  1. Lie on your abdomen on a firm  surface. A bed may be too soft for this exercise. 2. Prop yourself up on your elbows. 3. Use your arms to help lift your chest up until you feel a gentle stretch in your abdomen and your lower back. ? This will place some of your body weight on your elbows. If this is uncomfortable, try stacking pillows under your chest. ? Your hips should stay down, against the surface that you are lying on. Keep your hip and back muscles relaxed. 4. Hold for __________ seconds. 5. Slowly relax your upper body and return to the starting position. Repeat __________ times. Complete this exercise __________ times a day. Strengthening exercises These exercises build strength and endurance in your back. Endurance is the ability to use your muscles for a long time,  even after they get tired. Exercise D: Pelvic tilt 1. Lie on your back on a firm surface. Bend your knees and keep your feet flat. 2. Tense your abdominal muscles. Tip your pelvis up toward the ceiling and flatten your lower back into the floor. ? To help with this exercise, you may place a small towel under your lower back and try to push your back into the towel. 3. Hold for __________ seconds. 4. Let your muscles relax completely before you repeat this exercise. Repeat __________ times. Complete this exercise __________ times a day. Exercise E: Alternating arm and leg raises  1. Get on your hands and knees on a firm surface. If you are on a hard floor, you may want to use padding to cushion your knees, such as an exercise mat. 2. Line up your arms and legs. Your hands should be below your shoulders, and your knees should be below your hips. 3. Lift your left leg behind you. At the same time, raise your right arm and straighten it in front of you. ? Do not lift your leg higher than your hip. ? Do not lift your arm higher than your shoulder. ? Keep your abdominal and back muscles tight. ? Keep your hips facing the ground. ? Do not arch your back. ? Keep your balance carefully, and do not hold your breath. 4. Hold for __________ seconds. 5. Slowly return to the starting position and repeat with your right leg and your left arm. Repeat __________ times. Complete this exercise __________ times a day. Posture and body mechanics  Body mechanics refers to the movements and positions of your body while you do your daily activities. Posture is part of body mechanics. Good posture and healthy body mechanics can help to relieve stress in your body's tissues and joints. Good posture means that your spine is in its natural S-curve position (your spine is neutral), your shoulders are pulled back slightly, and your head is not tipped forward. The following are general guidelines for applying improved  posture and body mechanics to your everyday activities. Standing   When standing, keep your spine neutral and your feet about hip-width apart. Keep a slight bend in your knees. Your ears, shoulders, and hips should line up.  When you do a task in which you stand in one place for a long time, place one foot up on a stable object that is 2-4 inches (5-10 cm) high, such as a footstool. This helps keep your spine neutral. Sitting   When sitting, keep your spine neutral and keep your feet flat on the floor. Use a footrest, if necessary, and keep your thighs parallel to the floor. Avoid rounding your shoulders, and avoid tilting  your head forward.  When working at a desk or a computer, keep your desk at a height where your hands are slightly lower than your elbows. Slide your chair under your desk so you are close enough to maintain good posture.  When working at a computer, place your monitor at a height where you are looking straight ahead and you do not have to tilt your head forward or downward to look at the screen. Resting   When lying down and resting, avoid positions that are most painful for you.  If you have pain with activities such as sitting, bending, stooping, or squatting (flexion-based activities), lie in a position in which your body does not bend very much. For example, avoid curling up on your side with your arms and knees near your chest (fetal position).  If you have pain with activities such as standing for a long time or reaching with your arms (extension-based activities), lie with your spine in a neutral position and bend your knees slightly. Try the following positions: ? Lying on your side with a pillow between your knees. ? Lying on your back with a pillow under your knees. Lifting   When lifting objects, keep your feet at least shoulder-width apart and tighten your abdominal muscles.  Bend your knees and hips and keep your spine neutral. It is important to lift  using the strength of your legs, not your back. Do not lock your knees straight out.  Always ask for help to lift heavy or awkward objects. This information is not intended to replace advice given to you by your health care provider. Make sure you discuss any questions you have with your health care provider. Document Released: 07/27/2005 Document Revised: 04/02/2016 Document Reviewed: 04/12/2015 Elsevier Interactive Patient Education  [employer].

## 2017-12-02 NOTE — Progress Notes (Signed)
   LOW-RISK PREGNANCY VISIT Patient name: [name] [name] MRN [id number]  Date of birth: 11/17/1995 Chief Complaint:   Routine Prenatal Visit (back pain on lt side)  History of Present Illness:   [name] [name] is a 22 y.o. 832P1001 female at 6298w3d with an Estimated Date of Delivery: 12/13/17 being seen today for ongoing management of a low-risk pregnancy.  Today she reports sciatica. Contractions: Not present.  .  Movement: Present. denies leaking of fluid. Review of Systems:   Pertinent items are noted in HPI Denies abnormal vaginal discharge w/ itching/odor/irritation, headaches, visual changes, shortness of breath, chest pain, abdominal pain, severe nausea/vomiting, or problems with urination or bowel movements unless otherwise stated above. Pertinent History Reviewed:  Reviewed past medical,surgical, social, obstetrical and family history.  Reviewed problem list, medications and allergies. Physical Assessment:   Vitals:   12/02/17 1432  BP: 100/60  Pulse: 99  Weight: 125 lb (56.7 kg)  Body mass index is 20.8 kg/m.        Physical Examination:   General appearance: Well appearing, and in no distress  Mental status: Alert, oriented to person, place, and time  Skin: Warm & dry  Cardiovascular: Normal heart rate noted  Respiratory: Normal respiratory effort, no distress  Abdomen: Soft, gravid, nontender  Pelvic: Cervical exam performed  Dilation: 3 Effacement (%): 50 Station: -2  Extremities: Edema: Trace  Fetal Status: Fetal Heart Rate (bpm): 147 Fundal Height: 32 cm Movement: Present Presentation: Vertex  Results for orders placed or performed in visit on 12/02/17 (from the past 24 hour(s))  POCT urinalysis dipstick   Collection Time: 12/02/17  2:36 PM  Result Value Ref Range   Color, UA     Clarity, UA     Glucose, UA neg    Bilirubin, UA     Ketones, UA neg    Spec Grav, UA  1.010 - 1.025   Blood, UA neg    pH, UA  5.0 - 8.0   Protein, UA trace    Urobilinogen, UA  0.2 or 1.0 E.U./dL   Nitrite, UA neg    Leukocytes, UA Negative Negative   Appearance     Odor      Assessment & Plan:  1) Low-risk pregnancy G2P1001 at 8298w3d with an Estimated Date of Delivery: 12/13/17   2) Sciatica, gave printed exercises  3) Uterine size <dates, last efw 64% @ 36wks   Meds: No orders of the defined types were placed in this encounter.  Labs/procedures today: sve  Plan:  Continue routine obstetrical care   Reviewed: Term labor symptoms and general obstetric precautions including but not limited to vaginal bleeding, contractions, leaking of fluid and fetal movement were reviewed in detail with the patient.  All questions were answered  Follow-up: Return in about 1 week (around 12/09/2017) for LROB.  Orders Placed This Encounter  Procedures  . POCT urinalysis dipstick   Cheral MarkerKimberly R Chiquetta Langner CNM, [facility]NP[facility] 12/02/2017 3:11 PM

## 2017-12-09 ENCOUNTER — Inpatient Hospital Stay (EMERGENCY_DEPARTMENT_HOSPITAL)
Admission: AD | Admit: 2017-12-09 | Discharge: 2017-12-09 | Disposition: A | Discharge status: Home / Self Care | Payer: Medicaid Other | Source: Ambulatory Visit | Attending: Obstetrics & Gynecology | Admitting: Obstetrics & Gynecology

## 2017-12-09 ENCOUNTER — Encounter: Payer: Self-pay | Admitting: Women's Health

## 2017-12-09 ENCOUNTER — Ambulatory Visit (INDEPENDENT_AMBULATORY_CARE_PROVIDER_SITE_OTHER): Payer: Medicaid Other | Admitting: Women's Health

## 2017-12-09 ENCOUNTER — Other Ambulatory Visit: Payer: Self-pay

## 2017-12-09 VITALS — BP 104/66 | HR 89 | Wt 126.0 lb

## 2017-12-09 DIAGNOSIS — Z3A39 39 weeks gestation of pregnancy: Secondary | ICD-10-CM

## 2017-12-09 DIAGNOSIS — O26893 Other specified pregnancy related conditions, third trimester: Secondary | ICD-10-CM

## 2017-12-09 DIAGNOSIS — Z1389 Encounter for screening for other disorder: Secondary | ICD-10-CM

## 2017-12-09 DIAGNOSIS — Z331 Pregnant state, incidental: Secondary | ICD-10-CM

## 2017-12-09 DIAGNOSIS — O09293 Supervision of pregnancy with other poor reproductive or obstetric history, third trimester: Secondary | ICD-10-CM | POA: Diagnosis not present

## 2017-12-09 DIAGNOSIS — O26843 Uterine size-date discrepancy, third trimester: Secondary | ICD-10-CM | POA: Diagnosis not present

## 2017-12-09 DIAGNOSIS — N898 Other specified noninflammatory disorders of vagina: Secondary | ICD-10-CM

## 2017-12-09 DIAGNOSIS — Z3483 Encounter for supervision of other normal pregnancy, third trimester: Secondary | ICD-10-CM

## 2017-12-09 DIAGNOSIS — O479 False labor, unspecified: Secondary | ICD-10-CM

## 2017-12-09 DIAGNOSIS — R109 Unspecified abdominal pain: Secondary | ICD-10-CM

## 2017-12-09 LAB — POCT URINALYSIS DIPSTICK
Blood, UA: NEGATIVE
GLUCOSE UA: NEGATIVE
KETONES UA: NEGATIVE
Leukocytes, UA: NEGATIVE
Nitrite, UA: NEGATIVE
Protein, UA: NEGATIVE

## 2017-12-09 LAB — POCT FERN TEST: POCT Fern Test: NEGATIVE

## 2017-12-09 NOTE — Discharge Instructions (Signed)
Vaginal Delivery Vaginal delivery means that you will give birth by pushing your baby out of your birth canal (vagina). A team of health care providers will help you before, during, and after vaginal delivery. Birth experiences are unique for every woman and every pregnancy, and birth experiences vary depending on where you choose to give birth. What should I do to prepare for my baby's birth? Before your baby is born, it is important to talk with your health care provider about:  Your labor and delivery preferences. These may include: ? Medicines that you may be given. ? How you will manage your pain. This might include non-medical pain relief techniques or injectable pain relief such as epidural analgesia. ? How you and your baby will be monitored during labor and delivery. ? Who may be in the labor and delivery room with you. ? Your feelings about surgical delivery of your baby (cesarean delivery, or C-section) if this becomes necessary. ? Your feelings about receiving donated blood through an IV tube (blood transfusion) if this becomes necessary.  Whether you are able: ? To take pictures or videos of the birth. ? To eat during labor and delivery. ? To move around, walk, or change positions during labor and delivery.  What to expect after your baby is born, such as: ? Whether delayed umbilical cord clamping and cutting is offered. ? Who will care for your baby right after birth. ? Medicines or tests that may be recommended for your baby. ? Whether breastfeeding is supported in your hospital or birth center. ? How long you will be in the hospital or birth center.  How any medical conditions you have may affect your baby or your labor and delivery experience.  To prepare for your baby's birth, you should also:  Attend all of your health care visits before delivery (prenatal visits) as recommended by your health care provider. This is important.  Prepare your home for your baby's  arrival. Make sure that you have: ? Diapers. ? Baby clothing. ? Feeding equipment. ? Safe sleeping arrangements for you and your baby.  Install a car seat in your vehicle. Have your car seat checked by a certified car seat installer to make sure that it is installed safely.  Think about who will help you with your new baby at home for at least the first several weeks after delivery.  What can I expect when I arrive at the birth center or hospital? Once you are in labor and have been admitted into the hospital or birth center, your health care provider may:  Review your pregnancy history and any concerns you have.  Insert an IV tube into one of your veins. This is used to give you fluids and medicines.  Check your blood pressure, pulse, temperature, and heart rate (vital signs).  Check whether your bag of water (amniotic sac) has broken (ruptured).  Talk with you about your birth plan and discuss pain control options.  Monitoring Your health care provider may monitor your contractions (uterine monitoring) and your baby's heart rate (fetal monitoring). You may need to be monitored:  Often, but not continuously (intermittently).  All the time or for long periods at a time (continuously). Continuous monitoring may be needed if: ? You are taking certain medicines, such as medicine to relieve pain or make your contractions stronger. ? You have pregnancy or labor complications.  Monitoring may be done by:  Placing a special stethoscope or a handheld monitoring device on your abdomen to   check your baby's heartbeat, and feeling your abdomen for contractions. This method of monitoring does not continuously record your baby's heartbeat or your contractions.  Placing monitors on your abdomen (external monitors) to record your baby's heartbeat and the frequency and length of contractions. You may not have to wear external monitors all the time.  Placing monitors inside of your uterus  (internal monitors) to record your baby's heartbeat and the frequency, length, and strength of your contractions. ? Your health care provider may use internal monitors if he or she needs more information about the strength of your contractions or your baby's heart rate. ? Internal monitors are put in place by passing a thin, flexible wire through your vagina and into your uterus. Depending on the type of monitor, it may remain in your uterus or on your baby's head until birth. ? Your health care provider will discuss the benefits and risks of internal monitoring with you and will ask for your permission before inserting the monitors.  Telemetry. This is a type of continuous monitoring that can be done with external or internal monitors. Instead of having to stay in bed, you are able to move around during telemetry. Ask your health care provider if telemetry is an option for you.  Physical exam Your health care provider may perform a physical exam. This may include:  Checking whether your baby is positioned: ? With the head toward your vagina (head-down). This is most common. ? With the head toward the top of your uterus (head-up or breech). If your baby is in a breech position, your health care provider may try to turn your baby to a head-down position so you can deliver vaginally. If it does not seem that your baby can be born vaginally, your provider may recommend surgery to deliver your baby. In rare cases, you may be able to deliver vaginally if your baby is head-up (breech delivery). ? Lying sideways (transverse). Babies that are lying sideways cannot be delivered vaginally.  Checking your cervix to determine: ? Whether it is thinning out (effacing). ? Whether it is opening up (dilating). ? How low your baby has moved into your birth canal.  What are the three stages of labor and delivery?  Normal labor and delivery is divided into the following three stages: Stage 1  Stage 1 is the  longest stage of labor, and it can last for hours or days. Stage 1 includes: ? Early labor. This is when contractions may be irregular, or regular and mild. Generally, early labor contractions are more than 10 minutes apart. ? Active labor. This is when contractions get longer, more regular, more frequent, and more intense. ? The transition phase. This is when contractions happen very close together, are very intense, and may last longer than during any other part of labor.  Contractions generally feel mild, infrequent, and irregular at first. They get stronger, more frequent (about every 2-3 minutes), and more regular as you progress from early labor through active labor and transition.  Many women progress through stage 1 naturally, but you may need help to continue making progress. If this happens, your health care provider may talk with you about: ? Rupturing your amniotic sac if it has not ruptured yet. ? Giving you medicine to help make your contractions stronger and more frequent.  Stage 1 ends when your cervix is completely dilated to 4 inches (10 cm) and completely effaced. This happens at the end of the transition phase. Stage 2  Once   your cervix is completely effaced and dilated to 4 inches (10 cm), you may start to feel an urge to push. It is common for the body to naturally take a rest before feeling the urge to push, especially if you received an epidural or certain other pain medicines. This rest period may last for up to 1-2 hours, depending on your unique labor experience.  During stage 2, contractions are generally less painful, because pushing helps relieve contraction pain. Instead of contraction pain, you may feel stretching and burning pain, especially when the widest part of your baby's head passes through the vaginal opening (crowning).  Your health care provider will closely monitor your pushing progress and your baby's progress through the vagina during stage 2.  Your  health care provider may massage the area of skin between your vaginal opening and anus (perineum) or apply warm compresses to your perineum. This helps it stretch as the baby's head starts to crown, which can help prevent perineal tearing. ? In some cases, an incision may be made in your perineum (episiotomy) to allow the baby to pass through the vaginal opening. An episiotomy helps to make the opening of the vagina larger to allow more room for the baby to fit through.  It is very important to breathe and focus so your health care provider can control the delivery of your baby's head. Your health care provider may have you decrease the intensity of your pushing, to help prevent perineal tearing.  After delivery of your baby's head, the shoulders and the rest of the body generally deliver very quickly and without difficulty.  Once your baby is delivered, the umbilical cord may be cut right away, or this may be delayed for 1-2 minutes, depending on your baby's health. This may vary among health care providers, hospitals, and birth centers.  If you and your baby are healthy enough, your baby may be placed on your chest or abdomen to help maintain the baby's temperature and to help you bond with each other. Some mothers and babies start breastfeeding at this time. Your health care team will dry your baby and help keep your baby warm during this time.  Your baby may need immediate care if he or she: ? Showed signs of distress during labor. ? Has a medical condition. ? Was born too early (prematurely). ? Had a bowel movement before birth (meconium). ? Shows signs of difficulty transitioning from being inside the uterus to being outside of the uterus. If you are planning to breastfeed, your health care team will help you begin a feeding. Stage 3  The third stage of labor starts immediately after the birth of your baby and ends after you deliver the placenta. The placenta is an organ that develops  during pregnancy to provide oxygen and nutrients to your baby in the womb.  Delivering the placenta may require some pushing, and you may have mild contractions. Breastfeeding can stimulate contractions to help you deliver the placenta.  After the placenta is delivered, your uterus should tighten (contract) and become firm. This helps to stop bleeding in your uterus. To help your uterus contract and to control bleeding, your health care provider may: ? Give you medicine by injection, through an IV tube, by mouth, or through your rectum (rectally). ? Massage your abdomen or perform a vaginal exam to remove any blood clots that are left in your uterus. ? Empty your bladder by placing a thin, flexible tube (catheter) into your bladder. ? Encourage   you to breastfeed your baby. After labor is over, you and your baby will be monitored closely to ensure that you are both healthy until you are ready to go home. Your health care team will teach you how to care for yourself and your baby. This information is not intended to replace advice given to you by your health care provider. Make sure you discuss any questions you have with your health care provider. Document Released: 05/05/2008 Document Revised: 02/14/2016 Document Reviewed: 08/11/2015 Elsevier Interactive Patient Education  2018 [employer]  

## 2017-12-09 NOTE — MAU Provider Note (Signed)
Chief Complaint:  Rupture of Membranes   First Provider Initiated Contact with Patient 12/09/17 0259     HPI: [name] [name] is a 22 y.o. G2P1001 at 65w3dwho presents to maternity admissions reporting painful contractions, possible leaking of fluid, and aches in top of thighs.   Thigh pain is only with contractions. No weakness or numbness of feet/legs.  . She reports good fetal movement, denies vaginal bleeding, vaginal itching/burning, urinary symptoms, h/a, dizziness, n/v, diarrhea, constipation or fever/chills.    Abdominal Pain  This is a new problem. The current episode started today. The onset quality is gradual. The problem occurs intermittently. The problem has been unchanged. The pain is located in the suprapubic region, LLQ and RLQ. The quality of the pain is cramping and aching. Pain radiation: thighs. Pertinent negatives include no constipation, diarrhea, dysuria, fever or headaches. Nothing aggravates the pain. The pain is relieved by nothing. She has tried nothing for the symptoms.    RN Note: Possible ROM, pain and leg and belly   Past Medical History: Past Medical History:  Diagnosis Date  . Postpartum depression 02/19/2014    Past obstetric history: OB History  Gravida Para Term Preterm AB Living  SAB TAB Ectopic Multiple Live Births          1    # Outcome Date GA Lbr Len/2nd Weight Sex Delivery Anes PTL Lv  2 Current           1 Term 02/13/14 [redacted]w[redacted]d 10:40 / 00:17 6 lb 3 oz (2.807 kg) F Vag-Spont EPI N LIV     Birth Comments: None    Past Surgical History: Past Surgical History:  Procedure Laterality Date  . MYRINGOTOMY    . TONSILLECTOMY      Family History: Family History  Problem Relation Age of Onset  . Cancer Maternal Grandfather        stomach, throat  . Diabetes Cousin   . Alcohol abuse Neg Hx   . Arthritis Neg Hx   . Asthma Neg Hx   . COPD Neg Hx   . Birth defects Neg Hx   . Depression Neg Hx   . Drug abuse Neg Hx   .  Early death Neg Hx   . Hearing loss Neg Hx   . Heart disease Neg Hx   . Hyperlipidemia Neg Hx   . Hypertension Neg Hx   . Kidney disease Neg Hx   . Learning disabilities Neg Hx   . Mental illness Neg Hx   . Miscarriages / Stillbirths Neg Hx   . Mental retardation Neg Hx   . Vision loss Neg Hx   . Stroke Neg Hx   . Varicose Veins Neg Hx     Social History: Social History   Tobacco Use  . Smoking status: Former Smoker    Packs/day: 0.50    Years: 2.00    Pack years: 1.00    Types: Cigarettes    Last attempt to quit: 03/10/2016    Years since quitting: 1.7  . Smokeless tobacco: Never Used  Substance Use Topics  . Alcohol use: No  . Drug use: No    Allergies: No Known Allergies  Meds:  Medications Prior to Admission  Medication Sig Dispense Refill Last Dose  . acetaminophen (TYLENOL) 325 MG tablet Take 650 mg by mouth every 6 (six) hours as needed for mild pain or moderate pain.   Taking  . Calcium Carbonate Antacid (  TUMS PO) Take by mouth as needed.   Taking  . ondansetron (ZOFRAN ODT) 4 MG disintegrating tablet Take 1 tablet (4 mg total) by mouth every 6 (six) hours as needed for nausea. (Patient not taking: Reported on 12/02/2017) 30 tablet 2 Not Taking  . prenatal vitamin w/FE, FA (PRENATAL 1 + 1) 27-1 MG TABS tablet Take 1 tablet by mouth daily at 12 noon. 30 each 12 Taking    I have reviewed patient's Past Medical Hx, Surgical Hx, Family Hx, Social Hx, medications and allergies.   ROS:  Review of Systems  Constitutional: Negative for fever.  Gastrointestinal: Positive for abdominal pain. Negative for constipation and diarrhea.  Genitourinary: Negative for dysuria.  Neurological: Negative for headaches.   Other systems negative  Physical Exam   Patient Vitals for the past 24 hrs:  BP Temp Temp src Pulse Resp Height Weight  12/09/17 0245 117/81 97.6 F (36.4 C) Oral 84 20  (1.626 m) 128 lb (58.1 kg)   Constitutional: Well-developed, well-nourished female  in no acute distress.  Cardiovascular: normal rate and rhythm Respiratory: normal effort, clear to auscultation bilaterally GI: Abd soft, non-tender, gravid appropriate for gestational age.   No rebound or guarding. MS: Extremities nontender, no edema, normal ROM   Normal DP/PT pulses in both feet. No edema Neurologic: Alert and oriented x 4.  GU: Neg CVAT.  PELVIC EXAM: Negative pooling, negative ferning Dilation: 3 Effacement (%): 70 Station: -2 Presentation: Vertex Exam by:: Karl Ito, rnc   FHT:  Baseline 135 , moderate variability, accelerations present, no decelerations Contractions:  Irregular and mild, only lasting 30-40 seconds.  Does not have to breathe through them   Labs: Results for orders placed or performed during the hospital encounter of 12/09/17 (from the past 24 hour(s))  POCT fern test     Status: Normal   Collection Time: 12/09/17  2:53 AM  Result Value Ref Range   POCT Fern Test Negative = intact amniotic membranes       Imaging:    MAU Course/MDM: I have ordered labs and reviewed results. Fern negative NST reviewed, reactive, category I  Treatments in MAU included EFM.    Assessment: Single intrauterine pregnancy at [redacted]w[redacted]d Irregular uterine contractions, not in labor Vaginal discharge, not ruptured Referred thigh pain due to contractions  Plan: Discharge home Labor precautions and fetal kick counts Follow up in Office for prenatal visits and recheck  Encouraged to return here or to other Urgent Care/ED if she develops worsening of symptoms, increase in pain, fever, or other concerning symptoms.   Pt stable at time of discharge.  Wynelle Bourgeois CNM, MSN Certified Nurse-Midwife 12/09/2017 2:59 AM

## 2017-12-09 NOTE — MAU Note (Signed)
Possible ROM, pain and leg and belly

## 2017-12-09 NOTE — Progress Notes (Signed)
   LOW-RISK PREGNANCY VISIT Patient name: [name] [name] MRN [id number]  Date of birth: [birth year] Chief Complaint:   Routine Prenatal Visit  History of Present Illness:   [name] [name] is a 22 y.o. G22P1001 female at [redacted]w[redacted]d with an Estimated Date of Delivery: 12/13/17 being seen today for ongoing management of a low-risk pregnancy.  Today she reports no complaints. Went to [facility] early this am for r/o labor. Was 3/50/-2.  Contractions: Irregular. Vag. Bleeding: None.  Movement: Present. denies leaking of fluid. Review of Systems:   Pertinent items are noted in HPI Denies abnormal vaginal discharge w/ itching/odor/irritation, headaches, visual changes, shortness of breath, chest pain, abdominal pain, severe nausea/vomiting, or problems with urination or bowel movements unless otherwise stated above. Pertinent History Reviewed:  Reviewed past medical,surgical, social, obstetrical and family history.  Reviewed problem list, medications and allergies. Physical Assessment:   Vitals:   12/09/17 1432  BP: 104/66  Pulse: 89  Weight: 126 lb (57.2 kg)  Body mass index is 21.63 kg/m.        Physical Examination:   General appearance: Well appearing, and in no distress  Mental status: Alert, oriented to person, place, and time  Skin: Warm & dry  Cardiovascular: Normal heart rate noted  Respiratory: Normal respiratory effort, no distress  Abdomen: Soft, gravid, nontender  Pelvic: Cervical exam performed  Dilation: 4 Effacement (%): 50 Station: -2 Offered membrane sweeping, discussed r/b- pt decided to proceed, so membranes swept.   Extremities: Edema: None  Fetal Status: Fetal Heart Rate (bpm): 115 Fundal Height: 32 cm Movement: Present Presentation: Vertex  Results for orders placed or performed in visit on 12/09/17 (from the past 24 hour(s))  POCT urinalysis dipstick   Collection Time: 12/09/17  2:33 PM  Result Value Ref Range   Color, UA     Clarity, UA     Glucose, UA neg    Bilirubin, UA     Ketones, UA neg    Spec Grav, UA  1.010 - 1.025   Blood, UA neg    pH, UA  5.0 - 8.0   Protein, UA neg    Urobilinogen, UA  0.2 or 1.0 E.U./dL   Nitrite, UA neg    Leukocytes, UA Negative Negative   Appearance     Odor    Results for orders placed or performed during the hospital encounter of 12/09/17 (from the past 24 hour(s))  POCT fern test   Collection Time: 12/09/17  2:53 AM  Result Value Ref Range   POCT Fern Test Negative = intact amniotic membranes     Assessment & Plan:  1) Low-risk pregnancy G2P1001 at [redacted]w[redacted]d with an Estimated Date of Delivery: 12/13/17   2) Uterine size < dates w/ h/o IUGR prev preg, will get efw/afi u/s   Meds: No orders of the defined types were placed in this encounter.  Labs/procedures today: sve, membrane sweeping  Plan:  Continue routine obstetrical care   Reviewed: Term labor symptoms and general obstetric precautions including but not limited to vaginal bleeding, contractions, leaking of fluid and fetal movement were reviewed in detail with the patient.  All questions were answered  Follow-up: Return for asap for efw/afi u/s (no visit), then 1wk for LROB and bpp u/s.  Orders Placed This Encounter  Procedures  . US OB Follow Up  . POCT urinalysis dipstick   Cheral Marker CNM, [facility] 12/09/2017 3:01 PM

## 2017-12-09 NOTE — Patient Instructions (Signed)
[name] [name], I greatly value your feedback.  If you receive a survey following your visit with [place] today, we appreciate you taking the time to fill it out.  Thanks, [name], CNM, WHNP-BC   Call the office [phone]) or go to [facility] if:  You begin to have strong, frequent contractions  Your water breaks.  Sometimes it is a big gush of fluid, sometimes it is just a trickle that keeps getting your panties wet or running down your legs  You have vaginal bleeding.  It is normal to have a small amount of spotting if your cervix was checked.   You don't feel your baby moving like normal.  If you don't, get you something to eat and drink and lay down and focus on feeling your baby move.  You should feel at least 10 movements in 2 hours.  If you don't, you should call the office or go to [facility].     [facility] Contractions Contractions of the uterus can occur throughout pregnancy, but they are not always a sign that you are in labor. You may have practice contractions called Braxton Hicks contractions. These false labor contractions are sometimes confused with true labor. What are Deberah Pelton contractions? Braxton Hicks contractions are tightening movements that occur in the muscles of the uterus before labor. Unlike true labor contractions, these contractions do not result in opening (dilation) and thinning of the cervix. Toward the end of pregnancy (32-34 weeks), Braxton Hicks contractions can happen more often and may become stronger. These contractions are sometimes difficult to tell apart from true labor because they can be very uncomfortable. You should not feel embarrassed if you go to the hospital with false labor. Sometimes, the only way to tell if you are in true labor is for your health care provider to look for changes in the cervix. The health care provider will do a physical exam and may monitor your contractions. If you are not in true labor, the exam  should show that your cervix is not dilating and your water has not broken. If there are other health problems associated with your pregnancy, it is completely safe for you to be sent home with false labor. You may continue to have Braxton Hicks contractions until you go into true labor. How to tell the difference between true labor and false labor True labor  Contractions last 30-70 seconds.  Contractions become very regular.  Discomfort is usually felt in the top of the uterus, and it spreads to the lower abdomen and low back.  Contractions do not go away with walking.  Contractions usually become more intense and increase in frequency.  The cervix dilates and gets thinner. False labor  Contractions are usually shorter and not as strong as true labor contractions.  Contractions are usually irregular.  Contractions are often felt in the front of the lower abdomen and in the groin.  Contractions may go away when you walk around or change positions while lying down.  Contractions get weaker and are shorter-lasting as time goes on.  The cervix usually does not dilate or become thin. Follow these instructions at home:  Take over-the-counter and prescription medicines only as told by your health care provider.  Keep up with your usual exercises and follow other instructions from your health care provider.  Eat and drink lightly if you think you are going into labor.  If Braxton Hicks contractions are making you uncomfortable: ? Change your position from lying down  or resting to walking, or change from walking to resting. ? Sit and rest in a tub of warm water. ? Drink enough fluid to keep your urine pale yellow. Dehydration may cause these contractions. ? Do slow and deep breathing several times an hour.  Keep all follow-up prenatal visits as told by your health care provider. This is important. Contact a health care provider if:  You have a fever.  You have continuous pain  in your abdomen. Get help right away if:  Your contractions become stronger, more regular, and closer together.  You have fluid leaking or gushing from your vagina.  You pass blood-tinged mucus (bloody show).  You have bleeding from your vagina.  You have low back pain that you never had before.  You feel your baby's head pushing down and causing pelvic pressure.  Your baby is not moving inside you as much as it used to. Summary  Contractions that occur before labor are called Braxton Hicks contractions, false labor, or practice contractions.  Braxton Hicks contractions are usually shorter, weaker, farther apart, and less regular than true labor contractions. True labor contractions usually become progressively stronger and regular and they become more frequent.  Manage discomfort from [facility] contractions by changing position, resting in a warm bath, drinking plenty of water, or practicing deep breathing. This information is not intended to replace advice given to you by your health care provider. Make sure you discuss any questions you have with your health care provider. Document Released: 12/10/2016 Document Revised: 12/10/2016 Document Reviewed: 12/10/2016 Elsevier Interactive Patient Education  2018 [employer].

## 2017-12-10 ENCOUNTER — Telehealth: Payer: Self-pay | Admitting: *Deleted

## 2017-12-10 ENCOUNTER — Inpatient Hospital Stay (HOSPITAL_COMMUNITY)
Admission: AD | Admit: 2017-12-10 | Discharge: 2017-12-11 | DRG: 807 | Disposition: A | Discharge status: Home / Self Care | Payer: Medicaid Other | Source: Ambulatory Visit | Attending: Obstetrics and Gynecology | Admitting: Obstetrics and Gynecology

## 2017-12-10 ENCOUNTER — Encounter (HOSPITAL_COMMUNITY): Payer: Self-pay

## 2017-12-10 ENCOUNTER — Inpatient Hospital Stay (HOSPITAL_COMMUNITY): Payer: Medicaid Other | Admitting: Anesthesiology

## 2017-12-10 ENCOUNTER — Other Ambulatory Visit: Payer: Self-pay

## 2017-12-10 DIAGNOSIS — Z283 Underimmunization status: Secondary | ICD-10-CM

## 2017-12-10 DIAGNOSIS — Z3483 Encounter for supervision of other normal pregnancy, third trimester: Secondary | ICD-10-CM | POA: Diagnosis present

## 2017-12-10 DIAGNOSIS — O09899 Supervision of other high risk pregnancies, unspecified trimester: Secondary | ICD-10-CM

## 2017-12-10 DIAGNOSIS — Z3A39 39 weeks gestation of pregnancy: Secondary | ICD-10-CM | POA: Diagnosis not present

## 2017-12-10 DIAGNOSIS — Z8759 Personal history of other complications of pregnancy, childbirth and the puerperium: Secondary | ICD-10-CM

## 2017-12-10 DIAGNOSIS — Z87891 Personal history of nicotine dependence: Secondary | ICD-10-CM

## 2017-12-10 DIAGNOSIS — Z8659 Personal history of other mental and behavioral disorders: Secondary | ICD-10-CM

## 2017-12-10 DIAGNOSIS — Z3490 Encounter for supervision of normal pregnancy, unspecified, unspecified trimester: Secondary | ICD-10-CM

## 2017-12-10 LAB — CBC
HEMATOCRIT: 39.8 % (ref 36.0–46.0)
HEMOGLOBIN: 13.6 g/dL (ref 12.0–15.0)
MCH: 31.7 pg (ref 26.0–34.0)
MCHC: 34.2 g/dL (ref 30.0–36.0)
MCV: 92.8 fL (ref 78.0–100.0)
Platelets: 203 10*3/uL (ref 150–400)
RBC: 4.29 MIL/uL (ref 3.87–5.11)
RDW: 13.1 % (ref 11.5–15.5)
WBC: 16.1 10*3/uL — ABNORMAL HIGH (ref 4.0–10.5)

## 2017-12-10 LAB — TYPE AND SCREEN
ABO/RH(D): O POS
Antibody Screen: NEGATIVE

## 2017-12-10 LAB — RPR: RPR Ser Ql: NONREACTIVE

## 2017-12-10 LAB — ABO/RH: ABO/RH(D): O POS

## 2017-12-10 MED ORDER — PHENYLEPHRINE 40 MCG/ML (10ML) SYRINGE FOR IV PUSH (FOR BLOOD PRESSURE SUPPORT)
80.0000 ug | PREFILLED_SYRINGE | INTRAVENOUS | Status: DC | PRN
  Filled 2017-12-10: qty 5

## 2017-12-10 MED ORDER — LACTATED RINGERS IV SOLN
INTRAVENOUS | Status: DC
  Administered 2017-12-10: 01:00:00 via INTRAVENOUS

## 2017-12-10 MED ORDER — FERROUS SULFATE 325 (65 FE) MG PO TABS
325.0000 mg | ORAL_TABLET | Freq: Two times a day (BID) | ORAL | Status: DC
  Administered 2017-12-10 – 2017-12-11 (×3): 325 mg via ORAL
  Filled 2017-12-10 (×3): qty 1

## 2017-12-10 MED ORDER — OXYCODONE-ACETAMINOPHEN 5-325 MG PO TABS: 1.0000 | ORAL_TABLET | ORAL | Status: DC | PRN

## 2017-12-10 MED ORDER — FENTANYL 2.5 MCG/ML BUPIVACAINE 1/10 % EPIDURAL INFUSION (WH - ANES)
INTRAMUSCULAR | Status: AC
  Filled 2017-12-10: qty 100

## 2017-12-10 MED ORDER — ACETAMINOPHEN 325 MG PO TABS: 650.0000 mg | ORAL_TABLET | ORAL | Status: DC | PRN

## 2017-12-10 MED ORDER — MISOPROSTOL 200 MCG PO TABS
ORAL_TABLET | ORAL | Status: AC
  Filled 2017-12-10: qty 4

## 2017-12-10 MED ORDER — ZOLPIDEM TARTRATE 5 MG PO TABS: 5.0000 mg | ORAL_TABLET | Freq: Every evening | ORAL | Status: DC | PRN

## 2017-12-10 MED ORDER — FLEET ENEMA 7-19 GM/118ML RE ENEM: 1.0000 | ENEMA | Freq: Every day | RECTAL | Status: DC | PRN

## 2017-12-10 MED ORDER — PRENATAL MULTIVITAMIN CH
1.0000 | ORAL_TABLET | Freq: Every day | ORAL | Status: DC
  Administered 2017-12-10 – 2017-12-11 (×2): 1 via ORAL
  Filled 2017-12-10 (×2): qty 1

## 2017-12-10 MED ORDER — OXYCODONE-ACETAMINOPHEN 5-325 MG PO TABS: 2.0000 | ORAL_TABLET | ORAL | Status: DC | PRN

## 2017-12-10 MED ORDER — LACTATED RINGERS IV SOLN
500.0000 mL | Freq: Once | INTRAVENOUS | Status: AC
  Administered 2017-12-10: 500 mL via INTRAVENOUS

## 2017-12-10 MED ORDER — EPHEDRINE 5 MG/ML INJ
10.0000 mg | INTRAVENOUS | Status: DC | PRN
  Filled 2017-12-10: qty 2

## 2017-12-10 MED ORDER — MISOPROSTOL 200 MCG PO TABS
800.0000 ug | ORAL_TABLET | Freq: Once | ORAL | Status: AC
  Administered 2017-12-10: 800 ug via RECTAL

## 2017-12-10 MED ORDER — ONDANSETRON HCL 4 MG PO TABS: 4.0000 mg | ORAL_TABLET | ORAL | Status: DC | PRN

## 2017-12-10 MED ORDER — IBUPROFEN 600 MG PO TABS
600.0000 mg | ORAL_TABLET | Freq: Four times a day (QID) | ORAL | Status: DC
  Administered 2017-12-10 – 2017-12-11 (×7): 600 mg via ORAL
  Filled 2017-12-10 (×6): qty 1

## 2017-12-10 MED ORDER — OXYTOCIN 40 UNITS IN LACTATED RINGERS INFUSION - SIMPLE MED
2.5000 [IU]/h | INTRAVENOUS | Status: DC
  Filled 2017-12-10: qty 1000

## 2017-12-10 MED ORDER — METHYLERGONOVINE MALEATE 0.2 MG PO TABS: 0.2000 mg | ORAL_TABLET | ORAL | Status: DC | PRN

## 2017-12-10 MED ORDER — ONDANSETRON HCL 4 MG/2ML IJ SOLN: 4.0000 mg | Freq: Four times a day (QID) | INTRAMUSCULAR | Status: DC | PRN

## 2017-12-10 MED ORDER — METHYLERGONOVINE MALEATE 0.2 MG/ML IJ SOLN: 0.2000 mg | Freq: Once | INTRAMUSCULAR | Status: DC

## 2017-12-10 MED ORDER — FLEET ENEMA 7-19 GM/118ML RE ENEM: 1.0000 | ENEMA | RECTAL | Status: DC | PRN

## 2017-12-10 MED ORDER — NITROGLYCERIN 0.2 MG/ML ON CALL CATH LAB
INTRAVENOUS | Status: DC | PRN
  Administered 2017-12-10: 3 mL via INTRAVENOUS

## 2017-12-10 MED ORDER — WITCH HAZEL-GLYCERIN EX PADS: 1.0000 "application " | MEDICATED_PAD | CUTANEOUS | Status: DC | PRN

## 2017-12-10 MED ORDER — DIPHENHYDRAMINE HCL 25 MG PO CAPS: 25.0000 mg | ORAL_CAPSULE | Freq: Four times a day (QID) | ORAL | Status: DC | PRN

## 2017-12-10 MED ORDER — PHENYLEPHRINE 40 MCG/ML (10ML) SYRINGE FOR IV PUSH (FOR BLOOD PRESSURE SUPPORT)
PREFILLED_SYRINGE | INTRAVENOUS | Status: AC
  Filled 2017-12-10: qty 20

## 2017-12-10 MED ORDER — SOD CITRATE-CITRIC ACID 500-334 MG/5ML PO SOLN: 30.0000 mL | ORAL | Status: DC | PRN

## 2017-12-10 MED ORDER — LACTATED RINGERS IV SOLN: 500.0000 mL | INTRAVENOUS | Status: DC | PRN

## 2017-12-10 MED ORDER — BENZOCAINE-MENTHOL 20-0.5 % EX AERO: 1.0000 "application " | INHALATION_SPRAY | CUTANEOUS | Status: DC | PRN

## 2017-12-10 MED ORDER — PHENYLEPHRINE HCL 10 MG/ML IJ SOLN
INTRAMUSCULAR | Status: DC | PRN
Start: 2017-12-10 — End: 2017-12-10
  Administered 2017-12-10: 80 ug via INTRAVENOUS

## 2017-12-10 MED ORDER — METHYLERGONOVINE MALEATE 0.2 MG/ML IJ SOLN: 0.2000 mg | INTRAMUSCULAR | Status: DC | PRN

## 2017-12-10 MED ORDER — LIDOCAINE HCL (PF) 1 % IJ SOLN
INTRAMUSCULAR | Status: DC | PRN
  Administered 2017-12-10 (×2): 6 mL via EPIDURAL

## 2017-12-10 MED ORDER — DOCUSATE SODIUM 100 MG PO CAPS
100.0000 mg | ORAL_CAPSULE | Freq: Two times a day (BID) | ORAL | Status: DC
  Administered 2017-12-10 – 2017-12-11 (×2): 100 mg via ORAL
  Filled 2017-12-10 (×2): qty 1

## 2017-12-10 MED ORDER — ONDANSETRON HCL 4 MG/2ML IJ SOLN: 4.0000 mg | INTRAMUSCULAR | Status: DC | PRN

## 2017-12-10 MED ORDER — COCONUT OIL OIL: 1.0000 "application " | TOPICAL_OIL | Status: DC | PRN

## 2017-12-10 MED ORDER — BISACODYL 10 MG RE SUPP: 10.0000 mg | Freq: Every day | RECTAL | Status: DC | PRN

## 2017-12-10 MED ORDER — SIMETHICONE 80 MG PO CHEW: 80.0000 mg | CHEWABLE_TABLET | ORAL | Status: DC | PRN

## 2017-12-10 MED ORDER — MEASLES, MUMPS & RUBELLA VAC ~~LOC~~ INJ
0.5000 mL | INJECTION | Freq: Once | SUBCUTANEOUS | Status: DC
  Filled 2017-12-10: qty 0.5

## 2017-12-10 MED ORDER — DIBUCAINE 1 % RE OINT: 1.0000 "application " | TOPICAL_OINTMENT | RECTAL | Status: DC | PRN

## 2017-12-10 MED ORDER — METHYLERGONOVINE MALEATE 0.2 MG/ML IJ SOLN
INTRAMUSCULAR | Status: AC
  Administered 2017-12-10: 0.2 mg
  Filled 2017-12-10: qty 1

## 2017-12-10 MED ORDER — HYDROXYZINE HCL 50 MG PO TABS
50.0000 mg | ORAL_TABLET | Freq: Four times a day (QID) | ORAL | Status: DC | PRN
  Filled 2017-12-10: qty 1

## 2017-12-10 MED ORDER — FENTANYL CITRATE (PF) 100 MCG/2ML IJ SOLN: 50.0000 ug | INTRAMUSCULAR | Status: DC | PRN

## 2017-12-10 MED ORDER — DIPHENHYDRAMINE HCL 50 MG/ML IJ SOLN: 12.5000 mg | INTRAMUSCULAR | Status: DC | PRN

## 2017-12-10 MED ORDER — FENTANYL 2.5 MCG/ML BUPIVACAINE 1/10 % EPIDURAL INFUSION (WH - ANES)
14.0000 mL/h | INTRAMUSCULAR | Status: DC | PRN
  Administered 2017-12-10: 14 mL/h via EPIDURAL

## 2017-12-10 MED ORDER — TETANUS-DIPHTH-ACELL PERTUSSIS 5-2.5-18.5 LF-MCG/0.5 IM SUSP
0.5000 mL | Freq: Once | INTRAMUSCULAR | Status: AC
  Administered 2017-12-11: 0.5 mL via INTRAMUSCULAR

## 2017-12-10 MED ORDER — OXYTOCIN BOLUS FROM INFUSION
500.0000 mL | Freq: Once | INTRAVENOUS | Status: AC
  Administered 2017-12-10: 500 mL via INTRAVENOUS

## 2017-12-10 MED ORDER — LIDOCAINE HCL (PF) 1 % IJ SOLN
30.0000 mL | INTRAMUSCULAR | Status: DC | PRN
  Filled 2017-12-10: qty 30

## 2017-12-10 MED ORDER — SODIUM CHLORIDE 0.9 % IV SOLN
1.0000 g | Freq: Once | INTRAVENOUS | Status: AC
  Administered 2017-12-10: 1 g via INTRAVENOUS
  Filled 2017-12-10: qty 1

## 2017-12-10 NOTE — MAU Note (Signed)
Pt here with c/o contractions. Denies leaking

## 2017-12-10 NOTE — H&P (Signed)
LABOR AND DELIVERY ADMISSION HISTORY AND PHYSICAL NOTE  [name] [name] is a 22 y.o. female G2P1001 with IUP at [redacted]w[redacted]d by first trimester ultrasound presenting with spontaneous onset of labor.   She reports positive fetal movement. She denies leakage of fluid or vaginal bleeding.  Prenatal History/Complications: History of postpartum depression Varicella non-immune History of IUGR History of marijuana use  [facility]: [facility]  Sonogram:  Last completed 11/17/17 Cephalic presentation Anterior Grade 3 placenta EFW 3007g (64%)  Past Medical History: Past Medical History:  Diagnosis Date  . Postpartum depression 02/19/2014    Past Surgical History: Past Surgical History:  Procedure Laterality Date  . MYRINGOTOMY    . TONSILLECTOMY      Obstetrical History: OB History    Gravida  2   Para  1   Term  1   Preterm      AB      Living  1     SAB      TAB      Ectopic      Multiple      Live Births  1           Social History: Social History   Socioeconomic History  . Marital status: Single    Spouse name: Not on file  . Number of children: Not on file  . Years of education: Not on file  . Highest education level: Not on file  Occupational History  . Not on file  Social Needs  . Financial resource strain: Not on file  . Food insecurity:    Worry: Not on file    Inability: Not on file  . Transportation needs:    Medical: Not on file    Non-medical: Not on file  Tobacco Use  . Smoking status: Former Smoker    Packs/day: 0.50    Years: 2.00    Pack years: 1.00    Types: Cigarettes    Last attempt to quit: 03/10/2016    Years since quitting: 1.7  . Smokeless tobacco: Never Used  Substance and Sexual Activity  . Alcohol use: No  . Drug use: No  . Sexual activity: Yes    Birth control/protection: None  Lifestyle  . Physical activity:    Days per week: Not on file    Minutes per session: Not on file  . Stress: Not on file   Relationships  . Social connections:    Talks on phone: Not on file    Gets together: Not on file    Attends religious service: Not on file    Active member of club or organization: Not on file    Attends meetings of clubs or organizations: Not on file    Relationship status: Not on file  Other Topics Concern  . Not on file  Social History Narrative  . Not on file    Family History: Family History  Problem Relation Age of Onset  . Cancer Maternal Grandfather        stomach, throat  . Diabetes Cousin   . Alcohol abuse Neg Hx   . Arthritis Neg Hx   . Asthma Neg Hx   . COPD Neg Hx   . Birth defects Neg Hx   . Depression Neg Hx   . Drug abuse Neg Hx   . Early death Neg Hx   . Hearing loss Neg Hx   . Heart disease Neg Hx   . Hyperlipidemia Neg Hx   . Hypertension Neg  Hx   . Kidney disease Neg Hx   . Learning disabilities Neg Hx   . Mental illness Neg Hx   . Miscarriages / Stillbirths Neg Hx   . Mental retardation Neg Hx   . Vision loss Neg Hx   . Stroke Neg Hx   . Varicose Veins Neg Hx     Allergies: No Known Allergies  Medications Prior to Admission  Medication Sig Dispense Refill Last Dose  . prenatal vitamin w/FE, FA (PRENATAL 1 + 1) 27-1 MG TABS tablet Take 1 tablet by mouth daily at 12 noon. 30 each 12 12/09/2017 at Unknown time  . acetaminophen (TYLENOL) 325 MG tablet Take 650 mg by mouth every 6 (six) hours as needed for mild pain or moderate pain.   More than a month at Unknown time  . Calcium Carbonate Antacid (TUMS PO) Take by mouth as needed.   More than a month at Unknown time  . ondansetron (ZOFRAN ODT) 4 MG disintegrating tablet Take 1 tablet (4 mg total) by mouth every 6 (six) hours as needed for nausea. (Patient not taking: Reported on 12/02/2017) 30 tablet 2 Not Taking     Review of Systems   All systems reviewed and negative except as stated in HPI  Blood pressure (!) 136/94, pulse 80, temperature (!) 97.5 F (36.4 C), temperature source Oral,  resp. rate 18, height  (1.626 m), weight 57.2 kg (126 lb), last menstrual period 02/12/2017, SpO2 100 %. General appearance: alert, cooperative and mild distress Lungs: clear to auscultation bilaterally Heart: regular rate and rhythm Abdomen: soft, non-tender; bowel sounds normal Extremities: No calf swelling or tenderness Presentation: cephalic Fetal monitoring: baseline 130, +accels, - decels, mod variability Uterine activity: ctx q1-2 min Dilation: 5 Effacement (%): 90 Station: 0 Exam by:: L Fields RN   Prenatal labs: ABO, Rh:  O positive Antibody: Comment, See Final Results (02/13 0858) Rubella:  pending RPR: Non Reactive (02/13 0858)  HBsAg: Negative (10/04 1511)  HIV: Non Reactive (02/13 0858)  GBS: Negative (04/18 1557)  2 hr GTT: normal Genetic screening:  normal Anatomy US: normal; female  Prenatal Transfer Tool  Maternal Diabetes: No Genetic Screening: Normal Maternal Ultrasounds/Referrals: Normal Fetal Ultrasounds or other Referrals:  None Maternal Substance Abuse:  Yes:  Type: Marijuana Significant Maternal Medications:  None Significant Maternal Lab Results: Lab values include: Group B Strep negative, Other: Varicella non-immune; rubella pending  Results for orders placed or performed during the hospital encounter of 12/10/17 (from the past 24 hour(s))  CBC   Collection Time: 12/10/17 12:38 AM  Result Value Ref Range   WBC 16.1 (H) 4.0 - 10.5 K/uL   RBC 4.29 3.87 - 5.11 MIL/uL   Hemoglobin 13.6 12.0 - 15.0 g/dL   HCT 16.1 09.6 - 04.5 %   MCV 92.8 78.0 - 100.0 fL   MCH 31.7 26.0 - 34.0 pg   MCHC 34.2 30.0 - 36.0 g/dL   RDW 40.9 81.1 - 91.4 %   Platelets 203 150 - 400 K/uL  Results for orders placed or performed in visit on 12/09/17 (from the past 24 hour(s))  POCT urinalysis dipstick   Collection Time: 12/09/17  2:33 PM  Result Value Ref Range   Color, UA     Clarity, UA     Glucose, UA neg    Bilirubin, UA     Ketones, UA neg    Spec Grav, UA   1.010 - 1.025   Blood, UA neg    pH,  UA  5.0 - 8.0   Protein, UA neg    Urobilinogen, UA  0.2 or 1.0 E.U./dL   Nitrite, UA neg    Leukocytes, UA Negative Negative   Appearance     Odor    Results for orders placed or performed during the hospital encounter of 12/09/17 (from the past 24 hour(s))  POCT fern test   Collection Time: 12/09/17  2:53 AM  Result Value Ref Range   POCT Fern Test Negative = intact amniotic membranes     Patient Active Problem List   Diagnosis Date Noted  . Normal labor 12/10/2017  . History of prior pregnancy with IUGR newborn 06/03/2017  . Supervision of normal pregnancy 05/13/2017  . Postpartum depression 02/19/2014  . Marijuana use 09/05/2013  . Susceptible to varicella (non-immune), currently pregnant 08/15/2013    Assessment: Sharell Hilmer is a 22 y.o. G2P1001 at [redacted]w[redacted]d here with spontaneous onset of labor  #Labor:Patient admitted in early labor. Will allow expectant management at this time.  #Pain: Desires epidural #FWB: Cat I at this time #ID:  GBS negative; no indication for antibiotic therapy #MOF: Both #MOC:Depo #Circ:  Yes, outpatient in clinic  Lise Auer, MD PGY-3 12/10/2017, 1:15 AM

## 2017-12-10 NOTE — Anesthesia Postprocedure Evaluation (Signed)
Anesthesia Post Note  Patient: Sena Hitch  Procedure(s) Performed: AN AD HOC LABOR EPIDURAL     Patient location during evaluation: Mother Baby Anesthesia Type: Epidural Level of consciousness: awake and alert and oriented Pain management: satisfactory to patient Vital Signs Assessment: post-procedure vital signs reviewed and stable Respiratory status: respiratory function stable Cardiovascular status: stable Postop Assessment: no headache, no backache, epidural receding, patient able to bend at knees, no signs of nausea or vomiting and adequate PO intake Anesthetic complications: no    Last Vitals:  Vitals:   12/10/17 1100 12/10/17 1200  BP: (!) 135/92 112/78  Pulse: 83   Resp: 16   Temp: 37.2 C   SpO2:      Last Pain:  Vitals:   12/10/17 1100  TempSrc: Axillary  PainSc: 5    Pain Goal: Patients Stated Pain Goal: 3 (12/10/17 1100)               Robert Sperl

## 2017-12-10 NOTE — Anesthesia Procedure Notes (Signed)
Epidural Patient location during procedure: OB Start time: 12/10/2017 1:25 AM End time: 12/10/2017 1:29 AM  Staffing Anesthesiologist: Leilani Able, MD Performed: anesthesiologist   Preanesthetic Checklist Completed: patient identified, site marked, surgical consent, pre-op evaluation, timeout performed, IV checked, risks and benefits discussed and monitors and equipment checked  Epidural Patient position: sitting Prep: site prepped and draped and DuraPrep Patient monitoring: continuous pulse ox and blood pressure Approach: midline Location: L3-L4 Injection technique: LOR air  Needle:  Needle type: Tuohy  Needle gauge: 17 G Needle length: 9 cm and 9 Needle insertion depth: 4 cm Catheter type: closed end flexible Catheter size: 19 Gauge Catheter at skin depth: 9 cm Test dose: negative and Other  Assessment Sensory level: T9 Events: blood not aspirated, injection not painful, no injection resistance, negative IV test and no paresthesia  Additional Notes Reason for block:procedure for pain

## 2017-12-10 NOTE — Telephone Encounter (Signed)
Left message with man for pt to call us back to schedule pp appointment.  12-10-17  AS

## 2017-12-10 NOTE — Anesthesia Preprocedure Evaluation (Signed)
Anesthesia Evaluation  Patient identified by MRN, date of birth, ID band Patient awake    Reviewed: Allergy & Precautions, H&P , NPO status , Patient's Chart, lab work & pertinent test results  Airway Mallampati: I  TM Distance: >3 FB Neck ROM: full    Dental no notable dental hx. (+) Teeth Intact   Pulmonary former smoker,    Pulmonary exam normal breath sounds clear to auscultation       Cardiovascular negative cardio ROS Normal cardiovascular exam Rhythm:regular Rate:Normal     Neuro/Psych negative neurological ROS     GI/Hepatic negative GI ROS, Neg liver ROS,   Endo/Other  negative endocrine ROS  Renal/GU negative Renal ROS  negative genitourinary   Musculoskeletal negative musculoskeletal ROS (+)   Abdominal Normal abdominal exam  (+)   Peds  Hematology negative hematology ROS (+)   Anesthesia Other Findings   Reproductive/Obstetrics (+) Pregnancy                             Anesthesia Physical Anesthesia Plan  ASA: II  Anesthesia Plan: Epidural   Post-op Pain Management:    Induction:   PONV Risk Score and Plan:   Airway Management Planned:   Additional Equipment:   Intra-op Plan:   Post-operative Plan:   Informed Consent: I have reviewed the patients History and Physical, chart, labs and discussed the procedure including the risks, benefits and alternatives for the proposed anesthesia with the patient or authorized representative who has indicated his/her understanding and acceptance.     Plan Discussed with:   Anesthesia Plan Comments:         Anesthesia Quick Evaluation  

## 2017-12-10 NOTE — Lactation Note (Signed)
This note was copied from a baby's chart. Lactation Consultation Note  Patient Name: [name] [name] [id number] Date: 12/10/2017 Reason for consult: Initial assessment;Term;1st time breastfeeding  57 hours old FT female who is still being exclusively BF by his mother, she's a P2 but this is her first time BF. Mom's feeding choice upon admission was to breast and formula feed. Baby was swaddled in bassinet when entering the room, offered assistance with latch, but mom politely declined saying that baby just fed at 4:40 pm. Asked mom to call for latch assistance when baby is ready to feed again. Mom has Hx of marijuana use during pregnancy cord results are still pending. MD addressed that infant does not have history of IUGR.  Taught mom how to hand express, and she was able to get two drops of colostrum out of each breast. Explained the benefits of breast massage prior BF and pumping. Both nipples looked intact upon examination with no signs of trauma; she has very compressible breast tissue. Mom does not have a pump at home, and she requested one from the hospital. Reviewed hand pump instructions, cleaning and storage; as well as milk storage guidelines.  Encouraged mom to feed baby STS 8-12 times/24 hours or sooner if feeding cues are present. Reviewed BF brochure, BF resources and feeding diary, both parents are aware of LC services and will call PRN. Lactation to follow up after cord results.  Maternal Data Formula Feeding for Exclusion: Yes Reason for exclusion: Mother's choice to formula and breast feed on admission Has patient been taught Hand Expression?: Yes Does the patient have breastfeeding experience prior to this delivery?: No(Mom did not breastfeed her first child)  Feeding Feeding Type: Breast Fed Length of feed: 15 min  Interventions Interventions: Breast feeding basics reviewed;Breast massage;Breast compression;Hand express;Hand pump  Lactation Tools Discussed/Used Tools:  Pump Breast pump type: Manual WIC Program: Yes Pump Review: Setup, frequency, and cleaning;Milk Storage Initiated by:: MPeck Date initiated:: 12/10/17   Consult Status Consult Status: Follow-up Date: 12/11/17 Follow-up type: In-patient    [name] [name] [name] [name] 12/10/2017, 6:32 PM

## 2017-12-11 LAB — RUBELLA SCREEN: RUBELLA: 2.05 {index} (ref >0.99)

## 2017-12-11 MED ORDER — IBUPROFEN 600 MG PO TABS: 600.0000 mg | ORAL_TABLET | Freq: Four times a day (QID) | ORAL | 0 refills | Status: DC | PRN

## 2017-12-11 NOTE — Progress Notes (Signed)
CSW received consult for history of postpartum depression. CSW spoke with mother of baby to offer support and complete assessment.  Patient and CSW discussed diagnosis of PPD, patient states that she experienced PPD after the birth of her daughter in 48. Patient informed CSW that the birth of her daughter was complicated by the absence of the FOB and her being so young. Patient states that her situation now is completely different, she is engaged to the father of this newborn, and that the two live together. Patient's newborn is Arts administrator. Patient states the father of the baby is extremely supportive of her and her daughter and is a great father. Patient received prenatal care at [facility]. Patient is not currently on any psychotropic medicatoins and does not have a mental health provider. CSW and patient discussed the baby blues period vs. perinatal mood disorders, discussed treatment, and gave resources for mental health follow up if concerns arise. CSW encouraged patient to reach out to her OBGYN at [facility] or call CSW Department at [facility]. CSW identifies no further need for intervention and no barriers to discharge at this time.  Edwin Dada, MSW, LCSW-A Clinical Social Worker [facility] [phone]

## 2017-12-11 NOTE — Discharge Summary (Signed)
OB Discharge Summary     Patient Name: [name] [name] DOB: [birth year] MRN: [id number]  Date of admission: 12/10/2017 Delivering MD: Jacklyn Shell   Date of discharge: 12/11/2017  Admitting diagnosis: 39.4 WEEKS CTX Intrauterine pregnancy: [redacted]w[redacted]d     Secondary diagnosis:  Active Problems:   Susceptible to varicella (non-immune), currently pregnant   History of postpartum depression   Supervision of normal pregnancy   Normal labor  Additional problems: none     Discharge diagnosis: Term Pregnancy Delivered                                                                                                Post partum procedures:manual removal of placenta; mefoxin x 1  Augmentation: AROM  Complications: None  Hospital course:  Onset of Labor With Vaginal Delivery     22 y.o. yo [id number] at [redacted]w[redacted]d was admitted in Latent Labor on 12/10/2017. Patient had an uncomplicated labor course as follows:  Membrane Rupture Time/Date: 6:20 AM ,12/10/2017   Intrapartum Procedures: Episiotomy: None [1]                                         Lacerations:  None [1]  Patient had a delivery of a Viable infant.  12/10/2017  Information for the patient's newborn:  Dacie, Mandel [[id number]]  Delivery Method: Vag-Spont    Shortly after delivery, pt's cx clamped down prior to placenta detaching. She was given NTG and had a manual removal of placenta followed by a dose of Mefoxin and cytotec/methergine. By PPD#1 she was doing well with nl lochia. She is ambulating, tolerating a regular diet, passing flatus, and urinating well. Patient is discharged home in stable condition on 12/11/17 per her request for early d/c. Social work consult ordered for hx of PPD- to be done prior to d/c.   Physical exam  Vitals:   12/10/17 1500 12/10/17 1759 12/10/17 2349 12/11/17 0601  BP: 109/78 112/76 114/81 103/68  Pulse: 78 75 83 75  Resp: Temp: 98.7 F (37.1 C) 97.9 F (36.6 C) 98 F  (36.7 C) 97.8 F (36.6 C)  TempSrc: Oral Oral Oral Oral  SpO2:      Weight:      Height:       General: alert and cooperative Lochia: appropriate Uterine Fundus: firm Incision: N/A DVT Evaluation: No evidence of DVT seen on physical exam. Labs: Lab Results  Component Value Date   WBC 16.1 (H) 12/10/2017   HGB 13.6 12/10/2017   HCT 39.8 12/10/2017   MCV 92.8 12/10/2017   PLT 203 12/10/2017   CMP Latest Ref Rng & Units 07/29/2017  Glucose 65 - 99 mg/dL 130(Q)  BUN 6 - 20 mg/dL 7  Creatinine 6.57 - 8.46 mg/dL 9.62  Sodium 952 - 841 mmol/L 133(L)  Potassium 3.5 - 5.1 mmol/L 3.4(L)  Chloride 101 - 111 mmol/L 99(L)  CO2 22 - 32 mmol/L 23  Calcium 8.9 - 10.3 mg/dL 8.9  Discharge instruction: per After Visit Summary and "Baby and Me Booklet".  After visit meds:  Allergies as of 12/11/2017   No Known Allergies     Medication List    STOP taking these medications   acetaminophen 325 MG tablet Commonly known as:  TYLENOL   ondansetron 4 MG disintegrating tablet Commonly known as:  ZOFRAN ODT     TAKE these medications   ibuprofen 600 MG tablet Commonly known as:  ADVIL,MOTRIN Take 1 tablet (600 mg total) by mouth every 6 (six) hours as needed.   prenatal vitamin w/FE, FA 27-1 MG Tabs tablet Take 1 tablet by mouth daily at 12 noon.       Diet: routine diet  Activity: Advance as tolerated. Pelvic rest for 6 weeks.   Outpatient follow up:4 weeks, or sooner with s/s of PPD Follow up Appt:No future appointments. Follow up Visit:No follow-ups on file.  Postpartum contraception: Depo Provera  Newborn Data: Live born female  Birth Weight: 7 lb 2.5 oz (3245 g) APGAR: 9, 10  Newborn Delivery   Birth date/time:  12/10/2017 06:25:00 Delivery type:  Vaginal, Spontaneous     Baby Feeding: Bottle and Breast Disposition:home with mother   12/11/2017 Cam Hai, CNM  9:41 AM

## 2017-12-11 NOTE — Discharge Instructions (Signed)
Postpartum Depression and Baby Blues  The postpartum period begins right after the birth of a baby. During this time, there is often a great amount of joy and excitement. It is also a time of many changes in the life of the parents. Regardless of how many times a mother gives birth, each child brings new challenges and dynamics to the family. It is not unusual to have feelings of excitement along with confusing shifts in moods, emotions, and thoughts. All mothers are at risk of developing postpartum depression or the "baby blues." These mood changes can occur right after giving birth, or they may occur many months after giving birth. The baby blues or postpartum depression can be mild or severe. Additionally, postpartum depression can go away rather quickly, or it can be a long-term condition.  What are the causes?  Raised hormone levels and the rapid drop in those levels are thought to be a main cause of postpartum depression and the baby blues. A number of hormones change during and after pregnancy. Estrogen and progesterone usually decrease right after the delivery of your baby. The levels of thyroid hormone and various cortisol steroids also rapidly drop. Other factors that play a role in these mood changes include major life events and genetics.  What increases the risk?  If you have any of the following risks for the baby blues or postpartum depression, know what symptoms to watch out for during the postpartum period. Risk factors that may increase the likelihood of getting the baby blues or postpartum depression include:  · Having a personal or family history of depression.  · Having depression while being pregnant.  · Having premenstrual mood issues or mood issues related to oral contraceptives.  · Having a lot of life stress.  · Having marital conflict.  · Lacking a social support network.  · Having a baby with special needs.  · Having health problems, such as diabetes.    What are the signs or  symptoms?  Symptoms of baby blues include:  · Brief changes in mood, such as going from extreme happiness to sadness.  · Decreased concentration.  · Difficulty sleeping.  · Crying spells, tearfulness.  · Irritability.  · Anxiety.    Symptoms of postpartum depression typically begin within the first month after giving birth. These symptoms include:  · Difficulty sleeping or excessive sleepiness.  · Marked weight loss.  · Agitation.  · Feelings of worthlessness.  · Lack of interest in activity or food.    Postpartum psychosis is a very serious condition and can be dangerous. Fortunately, it is rare. Displaying any of the following symptoms is cause for immediate medical attention. Symptoms of postpartum psychosis include:  · Hallucinations and delusions.  · Bizarre or disorganized behavior.  · Confusion or disorientation.    How is this diagnosed?  A diagnosis is made by an evaluation of your symptoms. There are no medical or lab tests that lead to a diagnosis, but there are various questionnaires that a health care provider may use to identify those with the baby blues, postpartum depression, or psychosis. Often, a screening tool called the Edinburgh Postnatal Depression Scale is used to diagnose depression in the postpartum period.  How is this treated?  The baby blues usually goes away on its own in 1-2 weeks. Social support is often all that is needed. You will be encouraged to get adequate sleep and rest. Occasionally, you may be given medicines to help you sleep.  Postpartum depression   requires treatment because it can last several months or longer if it is not treated. Treatment may include individual or group therapy, medicine, or both to address any social, physiological, and psychological factors that may play a role in the depression. Regular exercise, a healthy diet, rest, and social support may also be strongly recommended.  Postpartum psychosis is more serious and needs treatment right away.  Hospitalization is often needed.  Follow these instructions at home:  · Get as much rest as you can. Nap when the baby sleeps.  · Exercise regularly. Some women find yoga and walking to be beneficial.  · Eat a balanced and nourishing diet.  · Do little things that you enjoy. Have a cup of tea, take a bubble bath, read your favorite magazine, or listen to your favorite music.  · Avoid alcohol.  · Ask for help with household chores, cooking, grocery shopping, or running errands as needed. Do not try to do everything.  · Talk to people close to you about how you are feeling. Get support from your partner, family members, friends, or other new moms.  · Try to stay positive in how you think. Think about the things you are grateful for.  · Do not spend a lot of time alone.  · Only take over-the-counter or prescription medicine as directed by your health care provider.  · Keep all your postpartum appointments.  · Let your health care provider know if you have any concerns.  Contact a health care provider if:  You are having a reaction to or problems with your medicine.  Get help right away if:  · You have suicidal feelings.  · You think you may harm the baby or someone else.  This information is not intended to replace advice given to you by your health care provider. Make sure you discuss any questions you have with your health care provider.  Document Released: 04/30/2004 Document Revised: 01/02/2016 Document Reviewed: 05/08/2013  Elsevier Interactive Patient Education © 2017 Else[employer] Inc.  Vaginal Delivery, Care After  Refer to this sheet in the next few weeks. These instructions provide you with information about caring for yourself after vaginal delivery. Your health care provider may also give you more specific instructions. Your treatment has been planned according to current medical practices, but problems sometimes occur. Call your health care provider if you have any problems or questions.  What can I expect  after the procedure?  After vaginal delivery, it is common to have:  · Some bleeding from your vagina.  · Soreness in your abdomen, your vagina, and the area of skin between your vaginal opening and your anus (perineum).  · Pelvic cramps.  · Fatigue.    Follow these instructions at home:  Medicines  · Take over-the-counter and prescription medicines only as told by your health care provider.  · If you were prescribed an antibiotic medicine, take it as told by your health care provider. Do not stop taking the antibiotic until it is finished.  Driving    · Do not drive or operate heavy machinery while taking prescription pain medicine.  · Do not drive for 24 hours if you received a sedative.  Lifestyle  · Do not drink alcohol. This is especially important if you are breastfeeding or taking medicine to relieve pain.  · Do not use tobacco products, including cigarettes, chewing tobacco, or e-cigarettes. If you need help quitting, ask your health care provider.  Eating and drinking  · Drink at   least 8 eight-ounce glasses of water every day unless you are told not to by your health care provider. If you choose to breastfeed your baby, you may need to drink more water than this.  · Eat high-fiber foods every day. These foods may help prevent or relieve constipation. High-fiber foods include:  ? Whole grain cereals and breads.  ? Brown rice.  ? Beans.  ? Fresh fruits and vegetables.  Activity  · Return to your normal activities as told by your health care provider. Ask your health care provider what activities are safe for you.  · Rest as much as possible. Try to rest or take a nap when your baby is sleeping.  · Do not lift anything that is heavier than your baby or 10 lb (4.5 kg) until your health care provider says that it is safe.  · Talk with your health care provider about when you can engage in sexual activity. This may depend on your:  ? Risk of infection.  ? Rate of healing.  ? Comfort and desire to engage in sexual  activity.  Vaginal Care  · If you have an episiotomy or a vaginal tear, check the area every day for signs of infection. Check for:  ? More redness, swelling, or pain.  ? More fluid or blood.  ? Warmth.  ? Pus or a bad smell.  · Do not use tampons or douches until your health care provider says this is safe.  · Watch for any blood clots that may pass from your vagina. These may look like clumps of dark red, brown, or black discharge.  General instructions  · Keep your perineum clean and dry as told by your health care provider.  · Wear loose, comfortable clothing.  · Wipe from front to back when you use the toilet.  · Ask your health care provider if you can shower or take a bath. If you had an episiotomy or a perineal tear during labor and delivery, your health care provider may tell you not to take baths for a certain length of time.  · Wear a bra that supports your breasts and fits you well.  · If possible, have someone help you with household activities and help care for your baby for at least a few days after you leave the hospital.  · Keep all follow-up visits for you and your baby as told by your health care provider. This is important.  Contact a health care provider if:  · You have:  ? Vaginal discharge that has a bad smell.  ? Difficulty urinating.  ? Pain when urinating.  ? A sudden increase or decrease in the frequency of your bowel movements.  ? More redness, swelling, or pain around your episiotomy or vaginal tear.  ? More fluid or blood coming from your episiotomy or vaginal tear.  ? Pus or a bad smell coming from your episiotomy or vaginal tear.  ? A fever.  ? A rash.  ? Little or no interest in activities you used to enjoy.  ? Questions about caring for yourself or your baby.  · Your episiotomy or vaginal tear feels warm to the touch.  · Your episiotomy or vaginal tear is separating or does not appear to be healing.  · Your breasts are painful, hard, or turn red.  · You feel unusually sad or  worried.  · You feel nauseous or you vomit.  · You pass large blood clots from your vagina. If   you pass a blood clot from your vagina, save it to show to your health care provider. Do not flush blood clots down the toilet without having your health care provider look at them.  · You urinate more than usual.  · You are dizzy or light-headed.  · You have not breastfed at all and you have not had a menstrual period for 12 weeks after delivery.  · You have stopped breastfeeding and you have not had a menstrual period for 12 weeks after you stopped breastfeeding.  Get help right away if:  · You have:  ? Pain that does not go away or does not get better with medicine.  ? Chest pain.  ? Difficulty breathing.  ? Blurred vision or spots in your vision.  ? Thoughts about hurting yourself or your baby.  · You develop pain in your abdomen or in one of your legs.  · You develop a severe headache.  · You faint.  · You bleed from your vagina so much that you fill two sanitary pads in one hour.  This information is not intended to replace advice given to you by your health care provider. Make sure you discuss any questions you have with your health care provider.  Document Released: 07/24/2000 Document Revised: 01/08/2016 Document Reviewed: 08/11/2015  Elsevier Interactive Patient Education © 2018 Elsevier Inc.

## 2017-12-11 NOTE — Progress Notes (Signed)
Pt progressing appropriately. Discharge instructions given, verbalizes understanding.

## 2017-12-12 ENCOUNTER — Ambulatory Visit: Payer: Self-pay

## 2017-12-12 NOTE — Lactation Note (Addendum)
This note was copied from a baby's chart. Lactation Consultation Note  Patient Name: [name] [name] MVHQI'O Date: 12/12/2017 Reason for consult: Follow-up assessment  Visited with P2 Mom on day of discharge, baby 50 hrs old, baby 6% weight loss.  Breasts are filling, but soft on palpation.  Mom asking a lot of questions. Baby starting to cue while Mom talking, suggested she position and latch baby.  Mom stating she may formula feed some when she gets home.  Talked about benefits of exclusive breastfeeding, and not offering formula.  Mom concerned she has a 74 1/2 yr old at home.  Talked about benefits of not preparing formula etc.  Mom using cradle hold.  Recommended she use cross cradle to latch baby, and then after a minute of regular sucking, to switch to cradle hold.  Demonstrated alternate breast compression and multiple swallows identified for Mom.    Mom asking about medications and breastfeeding.  Mom suffers from headaches, recommended Motrin and Tylenol over ASA.  Mom usually takes Excedrin Migraine.  Information from [facility] copied for Mom on ASA and Caffeine.  L2 for Excedrin.    Encouraged STS and feeding baby often on cue.  Goal of 8-12 feedings per 24 hrs.   Engorgement prevention and treatment discussed.   Mom aware of OP lactation support available, and encouraged to call for any concerns.   Feeding Feeding Type: Breast Fed Length of feed: 10 min  LATCH Score Latch: Grasps breast easily, tongue down, lips flanged, rhythmical sucking.  Audible Swallowing: Spontaneous and intermittent  Type of Nipple: Everted at rest and after stimulation  Comfort (Breast/Nipple): Soft / non-tender  Hold (Positioning): Assistance needed to correctly position infant at breast and maintain latch.  LATCH Score: 9  Interventions Interventions: Breast feeding basics reviewed;Assisted with latch;Skin to skin;Breast massage;Hand express;Breast  compression;Adjust position;Support pillows;Position options;Expressed milk      Consult Status Consult Status: Complete Date: 12/12/17 Follow-up type: Call as needed    [name] [name] 12/12/2017, 9:53 AM

## 2017-12-13 ENCOUNTER — Telehealth: Payer: Self-pay | Admitting: *Deleted

## 2017-12-13 ENCOUNTER — Other Ambulatory Visit: Payer: Medicaid Other

## 2017-12-13 ENCOUNTER — Encounter: Payer: Medicaid Other | Admitting: Women's Health

## 2017-12-13 NOTE — Telephone Encounter (Signed)
VM not set up.  Tried to contact pt to set up pp appointment.  12-13-17  AS

## 2018-01-17 ENCOUNTER — Encounter: Payer: Self-pay | Admitting: Women's Health

## 2018-01-17 ENCOUNTER — Ambulatory Visit (INDEPENDENT_AMBULATORY_CARE_PROVIDER_SITE_OTHER): Payer: Medicaid Other | Admitting: Women's Health

## 2018-01-17 DIAGNOSIS — Z30013 Encounter for initial prescription of injectable contraceptive: Secondary | ICD-10-CM

## 2018-01-17 NOTE — Patient Instructions (Signed)
NO SEX UNTIL AFTER YOU GET YOUR BIRTH CONTROL   Medroxyprogesterone injection [Contraceptive] What is this medicine? MEDROXYPROGESTERONE (me DROX ee proe JES te rone) contraceptive injections prevent pregnancy. They provide effective birth control for 3 months. Depo-subQ Provera 104 is also used for treating pain related to endometriosis. This medicine may be used for other purposes; ask your health care provider or pharmacist if you have questions. COMMON BRAND NAME(S): Depo-Provera, Depo-subQ Provera 104 What should I tell my health care provider before I take this medicine? They need to know if you have any of these conditions: -frequently drink alcohol -asthma -blood vessel disease or a history of a blood clot in the lungs or legs -bone disease such as osteoporosis -breast cancer -diabetes -eating disorder (anorexia nervosa or bulimia) -high blood pressure -HIV infection or AIDS -kidney disease -liver disease -mental depression -migraine -seizures (convulsions) -stroke -tobacco smoker -vaginal bleeding -an unusual or allergic reaction to medroxyprogesterone, other hormones, medicines, foods, dyes, or preservatives -pregnant or trying to get pregnant -breast-feeding How should I use this medicine? Depo-Provera Contraceptive injection is given into a muscle. Depo-subQ Provera 104 injection is given under the skin. These injections are given by a health care professional. You must not be pregnant before getting an injection. The injection is usually given during the first 5 days after the start of a menstrual period or 6 weeks after delivery of a baby. Talk to your pediatrician regarding the use of this medicine in children. Special care may be needed. These injections have been used in female children who have started having menstrual periods. Overdosage: If you think you have taken too much of this medicine contact a poison control center or emergency room at once. NOTE: This  medicine is only for you. Do not share this medicine with others. What if I miss a dose? Try not to miss a dose. You must get an injection once every 3 months to maintain birth control. If you cannot keep an appointment, call and reschedule it. If you wait longer than 13 weeks between Depo-Provera contraceptive injections or longer than 14 weeks between Depo-subQ Provera 104 injections, you could get pregnant. Use another method for birth control if you miss your appointment. You may also need a pregnancy test before receiving another injection. What may interact with this medicine? Do not take this medicine with any of the following medications: -bosentan This medicine may also interact with the following medications: -aminoglutethimide -antibiotics or medicines for infections, especially rifampin, rifabutin, rifapentine, and griseofulvin -aprepitant -barbiturate medicines such as phenobarbital or primidone -bexarotene -carbamazepine -medicines for seizures like ethotoin, felbamate, oxcarbazepine, phenytoin, topiramate -modafinil -St. John's wort This list may not describe all possible interactions. Give your health care provider a list of all the medicines, herbs, non-prescription drugs, or dietary supplements you use. Also tell them if you smoke, drink alcohol, or use illegal drugs. Some items may interact with your medicine. What should I watch for while using this medicine? This drug does not protect you against HIV infection (AIDS) or other sexually transmitted diseases. Use of this product may cause you to lose calcium from your bones. Loss of calcium may cause weak bones (osteoporosis). Only use this product for more than 2 years if other forms of birth control are not right for you. The longer you use this product for birth control the more likely you will be at risk for weak bones. Ask your health care professional how you can keep strong bones. You may have a change   in bleeding pattern  or irregular periods. Many females stop having periods while taking this drug. If you have received your injections on time, your chance of being pregnant is very low. If you think you may be pregnant, see your health care professional as soon as possible. Tell your health care professional if you want to get pregnant within the next year. The effect of this medicine may last a long time after you get your last injection. What side effects may I notice from receiving this medicine? Side effects that you should report to your doctor or health care professional as soon as possible: -allergic reactions like skin rash, itching or hives, swelling of the face, lips, or tongue -breast tenderness or discharge -breathing problems -changes in vision -depression -feeling faint or lightheaded, falls -fever -pain in the abdomen, chest, groin, or leg -problems with balance, talking, walking -unusually weak or tired -yellowing of the eyes or skin Side effects that usually do not require medical attention (report to your doctor or health care professional if they continue or are bothersome): -acne -fluid retention and swelling -headache -irregular periods, spotting, or absent periods -temporary pain, itching, or skin reaction at site where injected -weight gain This list may not describe all possible side effects. Call your doctor for medical advice about side effects. You may report side effects to FDA at 1-800-FDA-1088. Where should I keep my medicine? This does not apply. The injection will be given to you by a health care professional. NOTE: This sheet is a summary. It may not cover all possible information. If you have questions about this medicine, talk to your doctor, pharmacist, or health care provider.  2018 Elsevier/Gold Standard (2008-08-17 18:37:56)  

## 2018-01-17 NOTE — Progress Notes (Signed)
   POSTPARTUM VISIT Patient name: [name] [name] MRN [id number]  Date of birth: 11/14/1995 Chief Complaint:   Postpartum Care  History of Present Illness:   [name] [name] is a 22 y.o. 62P2002 Caucasian female being seen today for a postpartum visit. She is 5 weeks postpartum following a spontaneous vaginal delivery at 39.4 gestational weeks. Anesthesia: epidural. I have fully reviewed the prenatal and intrapartum course. Pregnancy uncomplicated. Postpartum course has been uncomplicated. Bleeding no bleeding. Bowel function is normal. Bladder function is normal.  Patient is sexually active. Last sexual activity: last night.  Contraception method is wants depo.  [place] Postpartum Depression Screening: negative. Score 0.   Last pap 2018 at [facility].  Results were normal .  Patient's last menstrual period was 02/12/2017 (exact date).  Baby's course has been uncomplicated. Baby is feeding by breast.  Review of Systems:   Pertinent items are noted in HPI Denies Abnormal vaginal discharge w/ itching/odor/irritation, headaches, visual changes, shortness of breath, chest pain, abdominal pain, severe nausea/vomiting, or problems with urination or bowel movements. Pertinent History Reviewed:  Reviewed past medical,surgical, obstetrical and family history.  Reviewed problem list, medications and allergies. OB History  Gravida Para Term Preterm AB Living  2 2 2     2   SAB TAB Ectopic Multiple Live Births        0 2    # Outcome Date GA Lbr Len/2nd Weight Sex Delivery Anes PTL Lv  2 Term 12/10/17 [id number] 08:20 / 00:05 7 lb 2.5 oz (3.245 kg) M Vag-Spont EPI  LIV  1 Term 02/13/14 522w1d 10:40 / 00:17 6 lb 3 oz (2.807 kg) F Vag-Spont EPI N LIV     Birth Comments: None   Physical Assessment:   Vitals:   01/17/18 1112  BP: 95/65  Pulse: 61  Weight: 113 lb (51.3 kg)  Height: 5\' 3"  (1.6 m)  Body mass index is 20.02 kg/m.       Physical Examination:   General appearance: alert, well  appearing, and in no distress  Mental status: alert, oriented to person, place, and time  Skin: warm & dry   Cardiovascular: normal heart rate noted   Respiratory: normal respiratory effort, no distress   Breasts: deferred, no complaints   Abdomen: soft, non-tender   Pelvic: VULVA: normal appearing vulva with no masses, tenderness or lesions, UTERUS: uterus is normal size, shape, consistency and nontender  Rectal: no hemorrhoids  Extremities: no edema       No results found for this or any previous visit (from the past 24 hour(s)).  Assessment & Plan:  1) Postpartum exam 2) 5 wks s/p SVB 3) Breastfeeding 4) Depression screening 5) Contraception counseling, pt prefers Depo-Provera injections, abstinence until after injection  Meds: No orders of the defined types were placed in this encounter.   Follow-up: Return in about 9 days (around 01/26/2018) for am bhcg/pm depo.   No orders of the defined types were placed in this encounter.   Cheral MarkerKimberly R Booker CNM, [facility] LLCWHNP-BC 01/17/2018 11:54 AM

## 2018-01-26 ENCOUNTER — Encounter: Payer: Self-pay | Admitting: *Deleted

## 2018-01-26 ENCOUNTER — Ambulatory Visit (INDEPENDENT_AMBULATORY_CARE_PROVIDER_SITE_OTHER): Payer: Medicaid Other | Admitting: *Deleted

## 2018-01-26 ENCOUNTER — Other Ambulatory Visit: Payer: Self-pay

## 2018-01-26 ENCOUNTER — Other Ambulatory Visit: Payer: Medicaid Other

## 2018-01-26 DIAGNOSIS — Z30013 Encounter for initial prescription of injectable contraceptive: Secondary | ICD-10-CM

## 2018-01-26 DIAGNOSIS — Z3042 Encounter for surveillance of injectable contraceptive: Secondary | ICD-10-CM

## 2018-01-26 LAB — BETA HCG QUANT (REF LAB): hCG Quant: <1 m[IU]/mL

## 2018-01-26 MED ORDER — MEDROXYPROGESTERONE ACETATE 150 MG/ML IM SUSP
150.0000 mg | Freq: Once | INTRAMUSCULAR | Status: AC
  Administered 2018-01-26: 150 mg via INTRAMUSCULAR

## 2018-01-26 NOTE — Progress Notes (Signed)
Pt given DepoProvera 150mg IM right deltoid without complications. Advised to return in 12 weeks for next injection. 

## 2018-01-28 ENCOUNTER — Other Ambulatory Visit: Payer: Self-pay | Admitting: Women's Health

## 2018-01-28 MED ORDER — MEDROXYPROGESTERONE ACETATE 150 MG/ML IM SUSP: 150.0000 mg | INTRAMUSCULAR | 3 refills | Status: DC

## 2018-04-20 ENCOUNTER — Ambulatory Visit: Payer: Medicaid Other

## 2018-04-21 ENCOUNTER — Ambulatory Visit (INDEPENDENT_AMBULATORY_CARE_PROVIDER_SITE_OTHER): Payer: Medicaid Other

## 2018-04-21 DIAGNOSIS — Z3202 Encounter for pregnancy test, result negative: Secondary | ICD-10-CM

## 2018-04-21 DIAGNOSIS — Z3042 Encounter for surveillance of injectable contraceptive: Secondary | ICD-10-CM

## 2018-04-21 LAB — POCT URINE PREGNANCY: PREG TEST UR: NEGATIVE

## 2018-04-21 MED ORDER — MEDROXYPROGESTERONE ACETATE 150 MG/ML IM SUSP
150.0000 mg | Freq: Once | INTRAMUSCULAR | Status: AC
  Administered 2018-04-21: 150 mg via INTRAMUSCULAR

## 2018-04-21 NOTE — Progress Notes (Signed)
Pt here for depo injection 150 mg IM given rt VG. Tolerated well. Return 12 weeks for next injection. Pad CMA 

## 2018-07-14 ENCOUNTER — Ambulatory Visit: Payer: Medicaid Other

## 2018-07-15 ENCOUNTER — Encounter: Payer: Self-pay | Admitting: *Deleted

## 2018-07-15 ENCOUNTER — Ambulatory Visit (INDEPENDENT_AMBULATORY_CARE_PROVIDER_SITE_OTHER): Payer: Medicaid Other | Admitting: *Deleted

## 2018-07-15 DIAGNOSIS — Z3202 Encounter for pregnancy test, result negative: Secondary | ICD-10-CM

## 2018-07-15 DIAGNOSIS — Z308 Encounter for other contraceptive management: Secondary | ICD-10-CM

## 2018-07-15 DIAGNOSIS — Z3042 Encounter for surveillance of injectable contraceptive: Secondary | ICD-10-CM | POA: Diagnosis not present

## 2018-07-15 LAB — POCT URINE PREGNANCY: PREG TEST UR: NEGATIVE

## 2018-07-15 MED ORDER — MEDROXYPROGESTERONE ACETATE 150 MG/ML IM SUSP
150.0000 mg | Freq: Once | INTRAMUSCULAR | Status: AC
  Administered 2018-07-15: 150 mg via INTRAMUSCULAR

## 2018-07-15 NOTE — Progress Notes (Signed)
Pt here for Depo. Pt received shot in left hip. Pt tolerated shot well. Return in 12 weeks for next shot. JSY 

## 2018-10-07 ENCOUNTER — Ambulatory Visit: Payer: Medicaid Other | Admitting: *Deleted

## 2018-10-13 ENCOUNTER — Ambulatory Visit (INDEPENDENT_AMBULATORY_CARE_PROVIDER_SITE_OTHER): Payer: Medicaid Other | Admitting: *Deleted

## 2018-10-13 DIAGNOSIS — Z3042 Encounter for surveillance of injectable contraceptive: Secondary | ICD-10-CM | POA: Diagnosis not present

## 2018-10-13 DIAGNOSIS — Z3202 Encounter for pregnancy test, result negative: Secondary | ICD-10-CM | POA: Diagnosis not present

## 2018-10-13 LAB — POCT URINE PREGNANCY: PREG TEST UR: NEGATIVE

## 2018-10-13 MED ORDER — MEDROXYPROGESTERONE ACETATE 150 MG/ML IM SUSP
150.0000 mg | Freq: Once | INTRAMUSCULAR | Status: AC
  Administered 2018-10-13: 150 mg via INTRAMUSCULAR

## 2018-10-13 NOTE — Progress Notes (Signed)
Depo Provera 150mg  given in right VG with no complications. Pt to return in 12 weeks for next injection.

## 2019-01-05 ENCOUNTER — Ambulatory Visit: Payer: Medicaid Other

## 2019-01-06 ENCOUNTER — Ambulatory Visit (INDEPENDENT_AMBULATORY_CARE_PROVIDER_SITE_OTHER): Payer: Medicaid Other | Admitting: *Deleted

## 2019-01-06 ENCOUNTER — Other Ambulatory Visit: Payer: Self-pay

## 2019-01-06 DIAGNOSIS — Z3042 Encounter for surveillance of injectable contraceptive: Secondary | ICD-10-CM | POA: Diagnosis not present

## 2019-01-06 DIAGNOSIS — Z308 Encounter for other contraceptive management: Secondary | ICD-10-CM

## 2019-01-06 MED ORDER — MEDROXYPROGESTERONE ACETATE 150 MG/ML IM SUSP
150.0000 mg | Freq: Once | INTRAMUSCULAR | Status: AC
  Administered 2019-01-06: 13:00:00 150 mg via INTRAMUSCULAR

## 2019-01-06 NOTE — Progress Notes (Signed)
Pt here for Depo. Pt received shot in right deltoid. Pt tolerated shot well. Return in 12 weeks for next Depo. Call with any problems. JSY

## 2019-03-31 ENCOUNTER — Telehealth: Payer: Self-pay | Admitting: Obstetrics & Gynecology

## 2019-03-31 ENCOUNTER — Ambulatory Visit: Payer: Medicaid Other

## 2019-03-31 NOTE — Telephone Encounter (Signed)
Pt is needing to refill of her depo shot that she is scheduled to receive today at 12:45pm. Please advise.

## 2019-03-31 NOTE — Telephone Encounter (Signed)
Depo called into pharmacy with 3 refills. Pt aware. Has an appt on Monday. [place]

## 2019-04-03 ENCOUNTER — Other Ambulatory Visit: Payer: Self-pay

## 2019-04-03 ENCOUNTER — Ambulatory Visit (INDEPENDENT_AMBULATORY_CARE_PROVIDER_SITE_OTHER): Payer: Medicaid Other | Admitting: *Deleted

## 2019-04-03 DIAGNOSIS — Z3042 Encounter for surveillance of injectable contraceptive: Secondary | ICD-10-CM

## 2019-04-03 MED ORDER — MEDROXYPROGESTERONE ACETATE 150 MG/ML IM SUSP
150.0000 mg | Freq: Once | INTRAMUSCULAR | Status: AC
  Administered 2019-04-03: 150 mg via INTRAMUSCULAR

## 2019-04-03 NOTE — Progress Notes (Signed)
Depo Provera 150mg IM given in left deltoid with no complications. Pt to return in 12 weeks for next injection.  

## 2019-06-26 ENCOUNTER — Ambulatory Visit: Payer: Medicaid Other

## 2019-07-11 ENCOUNTER — Ambulatory Visit (INDEPENDENT_AMBULATORY_CARE_PROVIDER_SITE_OTHER): Payer: Medicaid Other | Admitting: *Deleted

## 2019-07-11 ENCOUNTER — Other Ambulatory Visit: Payer: Self-pay

## 2019-07-11 ENCOUNTER — Other Ambulatory Visit: Payer: Medicaid Other

## 2019-07-11 DIAGNOSIS — Z3042 Encounter for surveillance of injectable contraceptive: Secondary | ICD-10-CM

## 2019-07-11 DIAGNOSIS — Z308 Encounter for other contraceptive management: Secondary | ICD-10-CM

## 2019-07-11 LAB — BETA HCG QUANT (REF LAB): hCG Quant: <1 m[IU]/mL

## 2019-07-11 MED ORDER — MEDROXYPROGESTERONE ACETATE 150 MG/ML IM SUSP
150.0000 mg | Freq: Once | INTRAMUSCULAR | Status: AC
  Administered 2019-07-11: 150 mg via INTRAMUSCULAR

## 2019-07-11 NOTE — Progress Notes (Signed)
   NURSE VISIT- INJECTION  SUBJECTIVE:  [name] [name] is a 23 y.o. G59P2002 female here for a Depo Provera for contraception/period management. She is a GYN patient.   OBJECTIVE:  There were no vitals taken for this visit.  Appears well, in no apparent distress  Injection administered in: Right deltoid  Meds ordered this encounter  Medications  . medroxyPROGESTERone (DEPO-PROVERA) injection 150 mg    ASSESSMENT: GYN patient Depo Provera for contraception/period management PLAN: Follow-up: in 11-13 weeks for next Depo   Levy Pupa  07/11/2019 4:01 PM

## 2019-10-03 ENCOUNTER — Other Ambulatory Visit: Payer: Self-pay

## 2019-10-03 ENCOUNTER — Ambulatory Visit (INDEPENDENT_AMBULATORY_CARE_PROVIDER_SITE_OTHER): Payer: Medicaid Other | Admitting: *Deleted

## 2019-10-03 DIAGNOSIS — Z3042 Encounter for surveillance of injectable contraceptive: Secondary | ICD-10-CM

## 2019-10-03 MED ORDER — MEDROXYPROGESTERONE ACETATE 150 MG/ML IM SUSP
150.0000 mg | Freq: Once | INTRAMUSCULAR | Status: AC
  Administered 2019-10-03: 15:00:00 150 mg via INTRAMUSCULAR

## 2019-10-03 NOTE — Progress Notes (Signed)
   NURSE VISIT- INJECTION  SUBJECTIVE:  [name] [name] is a 24 y.o. G45P2002 female here for a Depo Provera for contraception/period management. She is a GYN patient.   OBJECTIVE:  There were no vitals taken for this visit.  Appears well, in no apparent distress  Injection administered in: Left deltoid  No orders of the defined types were placed in this encounter.   ASSESSMENT: GYN patient Depo Provera for contraception/period management PLAN: Follow-up: in 11-13 weeks for next Depo   [name] [name], Faith Rogue  10/03/2019 2:28 PM

## 2019-11-27 ENCOUNTER — Other Ambulatory Visit: Payer: Self-pay

## 2019-11-27 ENCOUNTER — Other Ambulatory Visit (HOSPITAL_COMMUNITY)
Admission: RE | Admit: 2019-11-27 | Discharge: 2019-11-27 | Disposition: A | Discharge status: Home / Self Care | Payer: Medicaid Other | Source: Ambulatory Visit | Attending: Advanced Practice Midwife | Admitting: Advanced Practice Midwife

## 2019-11-27 ENCOUNTER — Encounter: Payer: Self-pay | Admitting: Advanced Practice Midwife

## 2019-11-27 ENCOUNTER — Ambulatory Visit (INDEPENDENT_AMBULATORY_CARE_PROVIDER_SITE_OTHER): Payer: Medicaid Other | Admitting: Advanced Practice Midwife

## 2019-11-27 VITALS — BP 127/80 | HR 101 | Ht 64.0 in | Wt 101.0 lb

## 2019-11-27 DIAGNOSIS — Z01419 Encounter for gynecological examination (general) (routine) without abnormal findings: Secondary | ICD-10-CM | POA: Insufficient documentation

## 2019-11-27 DIAGNOSIS — Z0000 Encounter for general adult medical examination without abnormal findings: Secondary | ICD-10-CM | POA: Diagnosis not present

## 2019-11-27 NOTE — Progress Notes (Signed)
[name] [name] 24 y.o.  Vitals:   11/27/19 1547  BP: 127/80  Pulse: (!) 101     Filed Weights   11/27/19 1547  Weight: 101 lb (45.8 kg)    Past Medical History: Past Medical History:  Diagnosis Date  . Postpartum depression 02/19/2014    Past Surgical History: Past Surgical History:  Procedure Laterality Date  . MYRINGOTOMY    . TONSILLECTOMY      Family History: Family History  Problem Relation Age of Onset  . Cancer Maternal Grandfather        stomach, throat  . Diabetes Cousin   . Alcohol abuse Neg Hx   . Arthritis Neg Hx   . Asthma Neg Hx   . COPD Neg Hx   . Birth defects Neg Hx   . Depression Neg Hx   . Drug abuse Neg Hx   . Early death Neg Hx   . Hearing loss Neg Hx   . Heart disease Neg Hx   . Hyperlipidemia Neg Hx   . Hypertension Neg Hx   . Kidney disease Neg Hx   . Learning disabilities Neg Hx   . Mental illness Neg Hx   . Miscarriages / Stillbirths Neg Hx   . Mental retardation Neg Hx   . Vision loss Neg Hx   . Stroke Neg Hx   . Varicose Veins Neg Hx     Social History: Social History   Tobacco Use  . Smoking status: Former Smoker    Packs/day: 0.50    Years: 2.00    Pack years: 1.00    Types: Cigarettes    Quit date: 03/10/2016    Years since quitting: 3.7  . Smokeless tobacco: Never Used  Substance Use Topics  . Alcohol use: No  . Drug use: No    Allergies: No Known Allergies    Current Outpatient Medications:  .  medroxyPROGESTERone (DEPO-PROVERA) 150 MG/ML injection, Inject 1 mL (150 mg total) into the muscle every 3 (three) months., Disp: 1 mL, Rfl: 3  History of Present Illness: Here for pap and physical. Last pap 2018 at [facility], normal per pt.  On depo ,  Likes it fine.  Sometimes has vaginal burning during sex.     Review of Systems   Patient denies any headaches, blurred vision, shortness of breath, chest pain, abdominal pain, problems with bowel movements, urination, or intercourse.   Physical Exam: General:   Well developed, well nourished, no acute distress Skin:  Warm and dry Neck:  Midline trachea, normal thyroid Lungs; Clear to auscultation bilaterally Breast:  No dominant palpable mass, retraction, or nipple discharge Cardiovascular: Regular rate and rhythm Abdomen:  Soft, non tender, no hepatosplenomegaly Pelvic:  External genitalia is normal in appearance.  The vagina is normal in appearance. No evidence of infection  The cervix is bulbous.  Uterus is felt to be normal size, shape, and contour.  No adnexal masses or tenderness noted.  Extremities:  No swelling or varicosities noted Psych:  No mood changes.     Impression: normal GYN exam     Plan: if pap normal, repeat q 3 years

## 2019-11-27 NOTE — Patient Instructions (Signed)
Probiotics for vagina

## 2019-11-29 LAB — CYTOLOGY - PAP
Chlamydia: NEGATIVE
Comment: NEGATIVE
Comment: NEGATIVE
Comment: NORMAL
Diagnosis: NEGATIVE
High risk HPV: NEGATIVE
Neisseria Gonorrhea: NEGATIVE

## 2020-01-01 ENCOUNTER — Ambulatory Visit: Payer: Medicaid Other

## 2020-01-11 ENCOUNTER — Ambulatory Visit: Payer: Medicaid Other

## 2020-01-11 ENCOUNTER — Other Ambulatory Visit: Payer: Medicaid Other

## 2020-01-24 ENCOUNTER — Ambulatory Visit (INDEPENDENT_AMBULATORY_CARE_PROVIDER_SITE_OTHER): Payer: Medicaid Other | Admitting: *Deleted

## 2020-01-24 ENCOUNTER — Other Ambulatory Visit: Payer: Medicaid Other

## 2020-01-24 DIAGNOSIS — Z3042 Encounter for surveillance of injectable contraceptive: Secondary | ICD-10-CM

## 2020-01-24 LAB — BETA HCG QUANT (REF LAB): hCG Quant: <1 m[IU]/mL

## 2020-01-24 MED ORDER — MEDROXYPROGESTERONE ACETATE 150 MG/ML IM SUSP
150.0000 mg | Freq: Once | INTRAMUSCULAR | Status: AC
  Administered 2020-01-24: 150 mg via INTRAMUSCULAR

## 2020-01-24 NOTE — Progress Notes (Signed)
   NURSE VISIT- INJECTION  SUBJECTIVE:  [name] [name] is a 24 y.o. G24P2002 female here for a Depo Provera for contraception/period management. She is a GYN patient.   OBJECTIVE:  There were no vitals taken for this visit.  Appears well, in no apparent distress  Injection administered in: Right deltoid  Meds ordered this encounter  Medications  . medroxyPROGESTERone (DEPO-PROVERA) injection 150 mg    ASSESSMENT: GYN patient Depo Provera for contraception/period management PLAN: Follow-up: in 11-13 weeks for next Depo   Stoney Bang  01/24/2020 2:44 PM

## 2020-01-28 ENCOUNTER — Other Ambulatory Visit: Payer: Self-pay

## 2020-01-28 ENCOUNTER — Ambulatory Visit
Admission: EM | Admit: 2020-01-28 | Discharge: 2020-01-28 | Disposition: A | Discharge status: Home / Self Care | Payer: Medicaid Other | Attending: Emergency Medicine | Admitting: Emergency Medicine

## 2020-01-28 DIAGNOSIS — J069 Acute upper respiratory infection, unspecified: Secondary | ICD-10-CM | POA: Insufficient documentation

## 2020-01-28 DIAGNOSIS — J029 Acute pharyngitis, unspecified: Secondary | ICD-10-CM | POA: Insufficient documentation

## 2020-01-28 DIAGNOSIS — Z1152 Encounter for screening for COVID-19: Secondary | ICD-10-CM | POA: Diagnosis present

## 2020-01-28 DIAGNOSIS — R519 Headache, unspecified: Secondary | ICD-10-CM | POA: Diagnosis not present

## 2020-01-28 DIAGNOSIS — J0100 Acute maxillary sinusitis, unspecified: Secondary | ICD-10-CM

## 2020-01-28 LAB — POCT RAPID STREP A (OFFICE): Rapid Strep A Screen: NEGATIVE

## 2020-01-28 MED ORDER — AZITHROMYCIN 250 MG PO TABS: 250.0000 mg | ORAL_TABLET | Freq: Every day | ORAL | 0 refills | Status: DC

## 2020-01-28 MED ORDER — FLUTICASONE PROPIONATE 50 MCG/ACT NA SUSP: 1.0000 | Freq: Every day | NASAL | 0 refills | Status: DC

## 2020-01-28 MED ORDER — PREDNISONE 10 MG PO TABS: 20.0000 mg | ORAL_TABLET | Freq: Every day | ORAL | 0 refills | Status: DC

## 2020-01-28 MED ORDER — CETIRIZINE HCL 10 MG PO TABS: 10.0000 mg | ORAL_TABLET | Freq: Every day | ORAL | 0 refills | Status: DC

## 2020-01-28 NOTE — ED Triage Notes (Signed)
Sore throat and sinus pressure that started this morning

## 2020-01-28 NOTE — Discharge Instructions (Signed)
POCT strep test was negative.  Sample will be sent for culture and someone will call if your result is abnormal COVID testing ordered.  It will take between 2-7 days for test results.  Someone will contact you regarding abnormal results.    In the meantime: You should remain isolated in your home for 10 days from symptom onset AND greater than 24 hours after symptoms resolution (absence of fever without the use of fever-reducing medication and improvement in respiratory symptoms), whichever is longer Get plenty of rest and push fluids Use medications daily for symptom relief Use OTC medications like ibuprofen or tylenol as needed fever or pain Call or go to the ED if you have any new or worsening symptoms such as fever, worsening cough, shortness of breath, chest tightness, chest pain, turning blue, changes in mental status, etc..Marland Kitchen

## 2020-01-28 NOTE — ED Provider Notes (Signed)
[name]   [id number] 01/28/20 Arrival Time: 1348   Chief Complaint  Patient presents with  . Facial Pain     SUBJECTIVE: History from: patient.  [name] [name] is a 24 y.o. female with history of tonsillectomy  presents to the urgent care with a complaint of sore throat, sinus pressure  Sinus pain, and nasal congestion with yellowish-green discharge that started this morning.  Reported her kids were sick earlier in the week.  Denies recent travel.  Has tried OTC medication without relief.  Symptoms are made worse with lying down.  Denies previous symptoms in the past.   Denies fever, chills, fatigue, rhinorrhea,  SOB, wheezing, chest pain, nausea, changes in bowel or bladder habits.     ROS: As per HPI.  All other pertinent ROS negative.     Past Medical History:  Diagnosis Date  . Postpartum depression 02/19/2014   Past Surgical History:  Procedure Laterality Date  . MYRINGOTOMY    . TONSILLECTOMY     No Known Allergies No current facility-administered medications on file prior to encounter.   Current Outpatient Medications on File Prior to Encounter  Medication Sig Dispense Refill  . medroxyPROGESTERone (DEPO-PROVERA) 150 MG/ML injection Inject 1 mL (150 mg total) into the muscle every 3 (three) months. 1 mL 3   Social History   Socioeconomic History  . Marital status: Single    Spouse name: Not on file  . Number of children: Not on file  . Years of education: Not on file  . Highest education level: Not on file  Occupational History  . Not on file  Tobacco Use  . Smoking status: Former Smoker    Packs/day: 0.50    Years: 2.00    Pack years: 1.00    Types: Cigarettes    Quit date: 03/10/2016    Years since quitting: 3.8  . Smokeless tobacco: Never Used  Vaping Use  . Vaping Use: Never used  Substance and Sexual Activity  . Alcohol use: No  . Drug use: No  . Sexual activity: Yes    Birth control/protection: Injection  Other Topics Concern   . Not on file  Social History Narrative  . Not on file   Social Determinants of Health   Financial Resource Strain: Low Risk   . Difficulty of Paying Living Expenses: Not hard at all  Food Insecurity: No Food Insecurity  . Worried About Programme researcher, broadcasting/film/video in the Last Year: Never true  . Ran Out of Food in the Last Year: Never true  Transportation Needs: No Transportation Needs  . Lack of Transportation (Medical): No  . Lack of Transportation (Non-Medical): No  Physical Activity: Sufficiently Active  . Days of Exercise per Week: 5 days  . Minutes of Exercise per Session: 30 min  Stress: No Stress Concern Present  . Feeling of Stress : Not at all  Social Connections: Socially Isolated  . Frequency of Communication with Friends and Family: More than three times a week  . Frequency of Social Gatherings with Friends and Family: Three times a week  . Attends Religious Services: Never  . Active Member of Clubs or Organizations: No  . Attends Banker Meetings: Never  . Marital Status: Never married  Intimate Partner Violence: Not At Risk  . Fear of Current or Ex-Partner: No  . Emotionally Abused: No  . Physically Abused: No  . Sexually Abused: No   Family History  Problem Relation Age of Onset  .  Cancer Maternal Grandfather        stomach, throat  . Diabetes Cousin   . Alcohol abuse Neg Hx   . Arthritis Neg Hx   . Asthma Neg Hx   . COPD Neg Hx   . Birth defects Neg Hx   . Depression Neg Hx   . Drug abuse Neg Hx   . Early death Neg Hx   . Hearing loss Neg Hx   . Heart disease Neg Hx   . Hyperlipidemia Neg Hx   . Hypertension Neg Hx   . Kidney disease Neg Hx   . Learning disabilities Neg Hx   . Mental illness Neg Hx   . Miscarriages / Stillbirths Neg Hx   . Mental retardation Neg Hx   . Vision loss Neg Hx   . Stroke Neg Hx   . Varicose Veins Neg Hx     OBJECTIVE:  Vitals:   01/28/20 1356 01/28/20 1359  BP:  125/88  Pulse:  100  Resp:  17  Temp:   98.5 F (36.9 C)  TempSrc:  Oral  SpO2:  98%  Weight: 100 lb (45.4 kg)   Height: 5\' 3"  (1.6 m)      Physical Exam Vitals and nursing note reviewed.  Constitutional:      General: She is not in acute distress.    Appearance: Normal appearance. She is normal weight. She is not ill-appearing, toxic-appearing or diaphoretic.  HENT:     Head: Normocephalic.     Right Ear: Tympanic membrane, ear canal and external ear normal. There is no impacted cerumen.     Left Ear: Tympanic membrane, ear canal and external ear normal. There is no impacted cerumen.     Nose: Congestion present. No rhinorrhea.     Right Sinus: Maxillary sinus tenderness present.     Left Sinus: Maxillary sinus tenderness present.     Mouth/Throat:     Mouth: Mucous membranes are moist.     Pharynx: No oropharyngeal exudate or posterior oropharyngeal erythema.  Cardiovascular:     Rate and Rhythm: Normal rate and regular rhythm.     Pulses: Normal pulses.     Heart sounds: Normal heart sounds. No murmur heard.  No friction rub. No gallop.   Pulmonary:     Effort: Pulmonary effort is normal. No respiratory distress.     Breath sounds: Normal breath sounds. No stridor. No wheezing, rhonchi or rales.  Chest:     Chest wall: No tenderness.  Neurological:     Mental Status: She is alert.    LABS:  Results for orders placed or performed during the hospital encounter of 01/28/20 (from the past 24 hour(s))  POCT rapid strep A     Status: None   Collection Time: 01/28/20  2:06 PM  Result Value Ref Range   Rapid Strep A Screen Negative Negative     ASSESSMENT & PLAN:  1. Acute non-recurrent maxillary sinusitis   2. Acute URI   3. Sore throat   4. Encounter for screening for COVID-19    COVID-19 completed for rule out.  Meds ordered this encounter  Medications  . predniSONE (DELTASONE) 10 MG tablet    Sig: Take 2 tablets (20 mg total) by mouth daily.    Dispense:  15 tablet    Refill:  0  . fluticasone  (FLONASE) 50 MCG/ACT nasal spray    Sig: Place 1 spray into both nostrils daily for 14 days.    Dispense:  16 g    Refill:  0  . cetirizine (ZYRTEC ALLERGY) 10 MG tablet    Sig: Take 1 tablet (10 mg total) by mouth daily.    Dispense:  30 tablet    Refill:  0  . azithromycin (ZITHROMAX) 250 MG tablet    Sig: Take 1 tablet (250 mg total) by mouth daily. Take first 2 tablets together, then 1 every day until finished.    Dispense:  6 tablet    Refill:  0    Discharge instructions POCT strep test was negative.  Sample will be sent for culture and someone will call if your result is abnormal COVID testing ordered.  It will take between 2-7 days for test results.  Someone will contact you regarding abnormal results.    In the meantime: You should remain isolated in your home for 10 days from symptom onset AND greater than 24 hours after symptoms resolution (absence of fever without the use of fever-reducing medication and improvement in respiratory symptoms), whichever is longer Get plenty of rest and push fluids Use medications daily for symptom relief Use OTC medications like ibuprofen or tylenol as needed fever or pain Call or go to the ED if you have any new or worsening symptoms such as fever, worsening cough, shortness of breath, chest tightness, chest pain, turning blue, changes in mental status, etc...   Reviewed expectations re: course of current medical issues. Questions answered. Outlined signs and symptoms indicating need for more acute intervention. Patient verbalized understanding. After Visit Summary given.    Note: This document was prepared using Dragon voice recognition software and may include unintentional dictation errors.      Durward Parcel, FNP 01/28/20 1443

## 2020-01-29 LAB — NOVEL CORONAVIRUS, NAA: SARS-CoV-2, NAA: NOT DETECTED

## 2020-01-29 LAB — SARS-COV-2, NAA 2 DAY TAT

## 2020-01-31 LAB — CULTURE, GROUP A STREP (THRC)

## 2020-04-17 ENCOUNTER — Other Ambulatory Visit: Payer: Self-pay

## 2020-04-17 ENCOUNTER — Ambulatory Visit (INDEPENDENT_AMBULATORY_CARE_PROVIDER_SITE_OTHER): Payer: Medicaid Other | Admitting: *Deleted

## 2020-04-17 ENCOUNTER — Other Ambulatory Visit: Payer: Self-pay | Admitting: Adult Health

## 2020-04-17 DIAGNOSIS — Z3042 Encounter for surveillance of injectable contraceptive: Secondary | ICD-10-CM

## 2020-04-17 MED ORDER — MEDROXYPROGESTERONE ACETATE 150 MG/ML IM SUSP: 150.0000 mg | INTRAMUSCULAR | 3 refills | Status: AC

## 2020-04-17 MED ORDER — MEDROXYPROGESTERONE ACETATE 150 MG/ML IM SUSP
150.0000 mg | Freq: Once | INTRAMUSCULAR | Status: AC
  Administered 2020-04-17: 150 mg via INTRAMUSCULAR

## 2020-04-17 NOTE — Progress Notes (Signed)
° °  NURSE VISIT- INJECTION  SUBJECTIVE:  [name] [name] is a 24 y.o. G12P2002 female here for a Depo Provera for contraception/period management. She is a GYN patient.   OBJECTIVE:  There were no vitals taken for this visit.  Appears well, in no apparent distress  Injection administered in: Left deltoid  No orders of the defined types were placed in this encounter.   ASSESSMENT: GYN patient Depo Provera for contraception/period management PLAN: Follow-up: in 11-13 weeks for next Depo   Nance Pear  04/17/2020 4:31 PM

## 2020-04-17 NOTE — Progress Notes (Signed)
Refilled depo has appt today for shot

## 2020-04-30 ENCOUNTER — Other Ambulatory Visit: Payer: Medicaid Other

## 2020-04-30 ENCOUNTER — Other Ambulatory Visit: Payer: Self-pay

## 2020-04-30 DIAGNOSIS — Z20822 Contact with and (suspected) exposure to covid-19: Secondary | ICD-10-CM

## 2020-05-02 LAB — SPECIMEN STATUS REPORT

## 2020-05-02 LAB — SARS-COV-2, NAA 2 DAY TAT

## 2020-05-02 LAB — NOVEL CORONAVIRUS, NAA: SARS-CoV-2, NAA: NOT DETECTED

## 2020-07-10 ENCOUNTER — Ambulatory Visit: Payer: Medicaid Other

## 2020-07-11 ENCOUNTER — Other Ambulatory Visit: Payer: Self-pay

## 2020-07-11 ENCOUNTER — Ambulatory Visit (INDEPENDENT_AMBULATORY_CARE_PROVIDER_SITE_OTHER): Payer: Medicaid Other | Admitting: *Deleted

## 2020-07-11 ENCOUNTER — Ambulatory Visit: Payer: Self-pay

## 2020-07-11 DIAGNOSIS — Z3042 Encounter for surveillance of injectable contraceptive: Secondary | ICD-10-CM

## 2020-07-11 MED ORDER — MEDROXYPROGESTERONE ACETATE 150 MG/ML IM SUSP
150.0000 mg | Freq: Once | INTRAMUSCULAR | Status: AC
  Administered 2020-07-11: 150 mg via INTRAMUSCULAR

## 2020-07-11 NOTE — Progress Notes (Signed)
   NURSE VISIT- INJECTION  SUBJECTIVE:  [name] [name] is a 24 y.o. G44P2002 female here for a Depo Provera for contraception/period management. She is a GYN patient.   OBJECTIVE:  There were no vitals taken for this visit.  Appears well, in no apparent distress  Injection administered in: Right deltoid  Meds ordered this encounter  Medications  . medroxyPROGESTERone (DEPO-PROVERA) injection 150 mg    ASSESSMENT: GYN patient Depo Provera for contraception/period management PLAN: Follow-up: in 11-13 weeks for next Depo   Annamarie Dawley  07/11/2020 4:07 PM

## 2020-09-26 ENCOUNTER — Ambulatory Visit: Payer: Medicaid Other

## 2021-06-11 ENCOUNTER — Ambulatory Visit
Admission: EM | Admit: 2021-06-11 | Discharge: 2021-06-11 | Disposition: A | Discharge status: Home / Self Care | Payer: Medicaid Other

## 2021-06-11 ENCOUNTER — Encounter: Payer: Self-pay | Admitting: Emergency Medicine

## 2021-06-11 ENCOUNTER — Other Ambulatory Visit: Payer: Self-pay

## 2021-06-11 DIAGNOSIS — J069 Acute upper respiratory infection, unspecified: Secondary | ICD-10-CM

## 2021-06-11 DIAGNOSIS — F17200 Nicotine dependence, unspecified, uncomplicated: Secondary | ICD-10-CM

## 2021-06-11 DIAGNOSIS — R051 Acute cough: Secondary | ICD-10-CM

## 2021-06-11 DIAGNOSIS — Z20822 Contact with and (suspected) exposure to covid-19: Secondary | ICD-10-CM

## 2021-06-11 MED ORDER — CETIRIZINE HCL 10 MG PO TABS
10.0000 mg | ORAL_TABLET | Freq: Every day | ORAL | 0 refills | Status: AC
Start: 2021-06-11 — End: ?

## 2021-06-11 MED ORDER — PSEUDOEPHEDRINE HCL 30 MG PO TABS
30.0000 mg | ORAL_TABLET | Freq: Three times a day (TID) | ORAL | 0 refills | Status: AC | PRN
Start: 2021-06-11 — End: ?

## 2021-06-11 MED ORDER — PROMETHAZINE-DM 6.25-15 MG/5ML PO SYRP: 5.0000 mL | ORAL_SOLUTION | Freq: Every evening | ORAL | 0 refills | Status: AC | PRN

## 2021-06-11 MED ORDER — BENZONATATE 100 MG PO CAPS: 100.0000 mg | ORAL_CAPSULE | Freq: Three times a day (TID) | ORAL | 0 refills | Status: DC | PRN

## 2021-06-11 NOTE — ED Triage Notes (Signed)
Cough, fever, vomiting, headache, stomach pain since Friday.

## 2021-06-11 NOTE — Discharge Instructions (Addendum)
We will notify you of your test results as they arrive and may take between 48-72 hours.  I encourage you to sign up for MyChart if you have not already done so as this can be the easiest way for us to communicate results to you online or through a phone app.  Generally, we only contact you if it is a positive test result.  In the meantime, if you develop worsening symptoms including fever, chest pain, shortness of breath despite our current treatment plan then please report to the emergency room as this may be a sign of worsening status from possible viral infection.  Otherwise, we will manage this as a viral syndrome. For sore throat or cough try using a honey-based tea. Use 3 teaspoons of honey with juice squeezed from half lemon. Place shaved pieces of ginger into 1/2-1 cup of water and warm over stove top. Then mix the ingredients and repeat every 4 hours as needed. Please take Tylenol 500mg-650mg every 6 hours for aches and pains, fevers. Hydrate very well with at least 2 liters of water. Eat light meals such as soups to replenish electrolytes and soft fruits, veggies. Start an antihistamine like Zyrtec for postnasal drainage, sinus congestion.  You can take this together with pseudoephedrine (Sudafed) at a dose of 30 mg 2-3 times a day as needed for the same kind of congestion.    

## 2021-06-11 NOTE — ED Provider Notes (Signed)
[facility]   MRN: [id number] DOB: 1996/03/22  Subjective:   [name] [name] is a 25 y.o. female presenting for 5 day history of acute onset runny and stuffy nose, coughing, throat pain, sinus headaches, nausea and vomiting.  Patient is also a smoker.  No chest pain, shortness of breath or wheezing.  No history of asthma.  Has had 2 sick contacts with her kids as well.  No current facility-administered medications for this encounter.  Current Outpatient Medications:    buprenorphine-naloxone (SUBOXONE) 2-0.5 mg SUBL SL tablet, Place 1 tablet under the tongue daily., Disp: , Rfl:    PARoxetine (PAXIL) 10 MG tablet, Take 10 mg by mouth daily., Disp: , Rfl:    azithromycin (ZITHROMAX) 250 MG tablet, Take 1 tablet (250 mg total) by mouth daily. Take first 2 tablets together, then 1 every day until finished., Disp: 6 tablet, Rfl: 0   cetirizine (ZYRTEC ALLERGY) 10 MG tablet, Take 1 tablet (10 mg total) by mouth daily., Disp: 30 tablet, Rfl: 0   fluticasone (FLONASE) 50 MCG/ACT nasal spray, Place 1 spray into both nostrils daily for 14 days., Disp: 16 g, Rfl: 0   medroxyPROGESTERone (DEPO-PROVERA) 150 MG/ML injection, Inject 1 mL (150 mg total) into the muscle every 3 (three) months., Disp: 1 mL, Rfl: 3   predniSONE (DELTASONE) 10 MG tablet, Take 2 tablets (20 mg total) by mouth daily., Disp: 15 tablet, Rfl: 0   No Known Allergies  Past Medical History:  Diagnosis Date   Postpartum depression 02/19/2014     Past Surgical History:  Procedure Laterality Date   MYRINGOTOMY     TONSILLECTOMY      Family History  Problem Relation Age of Onset   Cancer Maternal Grandfather        stomach, throat   Diabetes Cousin    Alcohol abuse Neg Hx    Arthritis Neg Hx    Asthma Neg Hx    COPD Neg Hx    Birth defects Neg Hx    Depression Neg Hx    Drug abuse Neg Hx    Early death Neg Hx    Hearing loss Neg Hx    Heart disease Neg Hx    Hyperlipidemia Neg Hx    Hypertension  Neg Hx    Kidney disease Neg Hx    Learning disabilities Neg Hx    Mental illness Neg Hx    Miscarriages / Stillbirths Neg Hx    Mental retardation Neg Hx    Vision loss Neg Hx    Stroke Neg Hx    Varicose Veins Neg Hx     Social History   Tobacco Use   Smoking status: Former    Packs/day: 0.50    Years: 2.00    Pack years: 1.00    Types: Cigarettes    Quit date: 03/10/2016    Years since quitting: 5.2   Smokeless tobacco: Never  Vaping Use   Vaping Use: Never used  Substance Use Topics   Alcohol use: No   Drug use: No    ROS   Objective:   Vitals: BP 116/70 (BP Location: Right Arm)   Pulse (!) 110   Temp 99.1 F (37.3 C) (Oral)   Resp 18   SpO2 97%   Physical Exam Constitutional:      General: She is not in acute distress.    Appearance: Normal appearance. She is well-developed. She is not ill-appearing, toxic-appearing or diaphoretic.  HENT:     Head:  Normocephalic and atraumatic.     Right Ear: Tympanic membrane, ear canal and external ear normal. No drainage or tenderness. No middle ear effusion. Tympanic membrane is not erythematous.     Left Ear: Tympanic membrane, ear canal and external ear normal. No drainage or tenderness.  No middle ear effusion. Tympanic membrane is not erythematous.     Nose: Congestion and rhinorrhea present.     Mouth/Throat:     Mouth: Mucous membranes are moist. No oral lesions.     Pharynx: No pharyngeal swelling, oropharyngeal exudate, posterior oropharyngeal erythema or uvula swelling.     Tonsils: No tonsillar exudate or tonsillar abscesses.     Comments: Significant postnasal drainage overlying pharynx. Eyes:     General: No scleral icterus.       Right eye: No discharge.        Left eye: No discharge.     Extraocular Movements: Extraocular movements intact.     Right eye: Normal extraocular motion.     Left eye: Normal extraocular motion.     Conjunctiva/sclera: Conjunctivae normal.     Pupils: Pupils are equal,  round, and reactive to light.  Cardiovascular:     Rate and Rhythm: Normal rate and regular rhythm.     Pulses: Normal pulses.     Heart sounds: Normal heart sounds. No murmur heard.   No friction rub. No gallop.  Pulmonary:     Effort: Pulmonary effort is normal. No respiratory distress.     Breath sounds: Normal breath sounds. No stridor. No wheezing, rhonchi or rales.  Musculoskeletal:     Cervical back: Normal range of motion and neck supple.  Lymphadenopathy:     Cervical: No cervical adenopathy.  Skin:    General: Skin is warm and dry.     Findings: No rash.  Neurological:     General: No focal deficit present.     Mental Status: She is alert and oriented to person, place, and time.  Psychiatric:        Mood and Affect: Mood normal.        Behavior: Behavior normal.        Thought Content: Thought content normal.      Assessment and Plan :   PDMP not reviewed this encounter.  1. Viral upper respiratory illness   2. Exposure to COVID-19 virus   3. Acute cough   4. Smoker    Discussed antibiotic stewardship. Deferred imaging given clear cardiopulmonary exam, hemodynamically stable vital signs. COVID and flu test pending.  We will otherwise manage for viral upper respiratory infection.  Physical exam findings reassuring and vital signs stable for discharge. Advised supportive care, offered symptomatic relief. Counseled patient on potential for adverse effects with medications prescribed/recommended today, ER and return-to-clinic precautions discussed, patient verbalized understanding.      Wallis Bamberg, PA-C 06/11/21 1701

## 2021-06-12 LAB — COVID-19, FLU A+B NAA
Influenza A, NAA: NOT DETECTED
Influenza B, NAA: NOT DETECTED
SARS-CoV-2, NAA: NOT DETECTED

## 2021-06-22 ENCOUNTER — Other Ambulatory Visit: Payer: Self-pay

## 2021-06-22 ENCOUNTER — Emergency Department (HOSPITAL_COMMUNITY)
Admission: EM | Admit: 2021-06-22 | Discharge: 2021-06-22 | Disposition: A | Discharge status: Home / Self Care | Payer: Medicaid Other | Attending: Emergency Medicine | Admitting: Emergency Medicine

## 2021-06-22 ENCOUNTER — Encounter (HOSPITAL_COMMUNITY): Payer: Self-pay | Admitting: Emergency Medicine

## 2021-06-22 DIAGNOSIS — R0989 Other specified symptoms and signs involving the circulatory and respiratory systems: Secondary | ICD-10-CM | POA: Diagnosis not present

## 2021-06-22 DIAGNOSIS — Z87891 Personal history of nicotine dependence: Secondary | ICD-10-CM | POA: Insufficient documentation

## 2021-06-22 DIAGNOSIS — K219 Gastro-esophageal reflux disease without esophagitis: Secondary | ICD-10-CM | POA: Diagnosis not present

## 2021-06-22 DIAGNOSIS — K2100 Gastro-esophageal reflux disease with esophagitis, without bleeding: Secondary | ICD-10-CM

## 2021-06-22 MED ORDER — LIDOCAINE VISCOUS HCL 2 % MT SOLN
15.0000 mL | Freq: Once | OROMUCOSAL | Status: AC
  Administered 2021-06-22: 15 mL via ORAL
  Filled 2021-06-22: qty 15

## 2021-06-22 MED ORDER — GI COCKTAIL ~~LOC~~: 30.0000 mL | Freq: Two times a day (BID) | ORAL | 1 refills | Status: DC | PRN

## 2021-06-22 MED ORDER — ALUM & MAG HYDROXIDE-SIMETH 200-200-20 MG/5ML PO SUSP
30.0000 mL | Freq: Once | ORAL | Status: AC
  Administered 2021-06-22: 30 mL via ORAL
  Filled 2021-06-22: qty 30

## 2021-06-22 MED ORDER — PANTOPRAZOLE SODIUM 20 MG PO TBEC
20.0000 mg | DELAYED_RELEASE_TABLET | Freq: Every day | ORAL | 0 refills | Status: AC
Start: 2021-06-22 — End: ?

## 2021-06-22 MED ORDER — GI COCKTAIL ~~LOC~~: 30.0000 mL | Freq: Two times a day (BID) | ORAL | 1 refills | Status: AC | PRN

## 2021-06-22 MED ORDER — SUCRALFATE 1 G PO TABS
1.0000 g | ORAL_TABLET | Freq: Three times a day (TID) | ORAL | 0 refills | Status: AC
Start: 2021-06-22 — End: ?

## 2021-06-22 NOTE — ED Provider Notes (Signed)
[facility] EMERGENCY DEPARTMENT Provider Note   CSN: [id number] Arrival date & time: 06/22/21  0217     History Chief Complaint  Patient presents with   Gastroesophageal Reflux    Shenekia Riess Shown is a 25 y.o. female.  Patient is a 25 year old female with history of anxiety and depression.  Patient presenting today for evaluation of burning in her throat and chest.  She also feels foreign body sensation and pain with swallowing.  This has been worsening over the past 2 days.  She reports eating spicy foods, but this is normal for her.  She denies any fevers or chills.  She has tried Prevacid at home with no relief.  The history is provided by the patient.      Past Medical History:  Diagnosis Date   Postpartum depression 02/19/2014    There are no problems to display for this patient.   Past Surgical History:  Procedure Laterality Date   MYRINGOTOMY     TONSILLECTOMY       OB History     Gravida  2   Para  2   Term  2   Preterm      AB      Living  2      SAB      IAB      Ectopic      Multiple  0   Live Births  2           Family History  Problem Relation Age of Onset   Cancer Maternal Grandfather        stomach, throat   Diabetes Cousin    Alcohol abuse Neg Hx    Arthritis Neg Hx    Asthma Neg Hx    COPD Neg Hx    Birth defects Neg Hx    Depression Neg Hx    Drug abuse Neg Hx    Early death Neg Hx    Hearing loss Neg Hx    Heart disease Neg Hx    Hyperlipidemia Neg Hx    Hypertension Neg Hx    Kidney disease Neg Hx    Learning disabilities Neg Hx    Mental illness Neg Hx    Miscarriages / Stillbirths Neg Hx    Mental retardation Neg Hx    Vision loss Neg Hx    Stroke Neg Hx    Varicose Veins Neg Hx     Social History   Tobacco Use   Smoking status: Former    Packs/day: 0.50    Years: 2.00    Pack years: 1.00    Types: Cigarettes    Quit date: 03/10/2016    Years since quitting: 5.2   Smokeless tobacco: Never   Vaping Use   Vaping Use: Never used  Substance Use Topics   Alcohol use: No   Drug use: No    Home Medications Prior to Admission medications   Medication Sig Start Date End Date Taking? Authorizing Provider  azithromycin (ZITHROMAX) 250 MG tablet Take 1 tablet (250 mg total) by mouth daily. Take first 2 tablets together, then 1 every day until finished. 01/28/20   Avegno, Zachery Dakins, FNP  benzonatate (TESSALON) 100 MG capsule Take 1-2 capsules (100-200 mg total) by mouth 3 (three) times daily as needed for cough. 06/11/21   Wallis Bamberg, PA-C  buprenorphine-naloxone (SUBOXONE) 2-0.5 mg SUBL SL tablet Place 1 tablet under the tongue daily.    [provider]  cetirizine (ZYRTEC ALLERGY) 10  MG tablet Take 1 tablet (10 mg total) by mouth daily. 06/11/21   Wallis Bamberg, PA-C  medroxyPROGESTERone (DEPO-PROVERA) 150 MG/ML injection Inject 1 mL (150 mg total) into the muscle every 3 (three) months. 04/17/20   Adline Potter, NP  PARoxetine (PAXIL) 10 MG tablet Take 10 mg by mouth daily.    [provider]  promethazine-dextromethorphan (PROMETHAZINE-DM) 6.25-15 MG/5ML syrup Take 5 mLs by mouth at bedtime as needed for cough. 06/11/21   Wallis Bamberg, PA-C  pseudoephedrine (SUDAFED) 30 MG tablet Take 1 tablet (30 mg total) by mouth every 8 (eight) hours as needed for congestion. 06/11/21   Wallis Bamberg, PA-C    Allergies    Patient has no known allergies.  Review of Systems   Review of Systems  All other systems reviewed and are negative.  Physical Exam Updated Vital Signs BP 123/82 (BP Location: Right Arm)   Pulse 73   Temp 98.3 F (36.8 C) (Oral)   Resp 18   Ht 5\' 3"  (1.6 m)   Wt 48.5 kg   SpO2 100%   BMI 18.95 kg/m   Physical Exam Vitals and nursing note reviewed.  Constitutional:      General: She is not in acute distress.    Appearance: She is well-developed. She is not diaphoretic.  HENT:     Head: Normocephalic and atraumatic.     Mouth/Throat:     Mouth:  Mucous membranes are moist.     Pharynx: Oropharynx is clear. No oropharyngeal exudate or posterior oropharyngeal erythema.  Cardiovascular:     Rate and Rhythm: Normal rate and regular rhythm.     Heart sounds: No murmur heard.   No friction rub. No gallop.  Pulmonary:     Effort: Pulmonary effort is normal. No respiratory distress.     Breath sounds: Normal breath sounds. No wheezing.  Abdominal:     General: Bowel sounds are normal. There is no distension.     Palpations: Abdomen is soft.     Tenderness: There is no abdominal tenderness.  Musculoskeletal:        General: Normal range of motion.     Cervical back: Normal range of motion and neck supple.  Skin:    General: Skin is warm and dry.  Neurological:     General: No focal deficit present.     Mental Status: She is alert and oriented to person, place, and time.    ED Results / Procedures / Treatments   Labs (all labs ordered are listed, but only abnormal results are displayed) Labs Reviewed - No data to display  EKG None  Radiology No results found.  Procedures Procedures   Medications Ordered in ED Medications  alum & mag hydroxide-simeth (MAALOX/MYLANTA) 200-200-20 MG/5ML suspension 30 mL (has no administration in time range)    And  lidocaine (XYLOCAINE) 2 % viscous mouth solution 15 mL (has no administration in time range)    ED Course  I have reviewed the triage vital signs and the nursing notes.  Pertinent labs & imaging results that were available during my care of the patient were reviewed by me and considered in my medical decision making (see chart for details).    MDM Rules/Calculators/A&P  Symptoms improved with a GI cocktail.  I suspect GERD as the cause, but also in the differential is globus sensation.  At this point, I feel as though patient can safely be discharged with Protonix and Carafate.  She is also requested a prescription  for GI cocktail which she will be given.  Final Clinical  Impression(s) / ED Diagnoses Final diagnoses:  None    Rx / DC Orders ED Discharge Orders     None        Geoffery Lyons, MD 06/22/21 [phone]

## 2021-06-22 NOTE — Discharge Instructions (Signed)
Begin taking Protonix and Carafate as prescribed.  Take GI cocktail as prescribed as needed for worsening reflux symptoms.  Follow-up with primary doctor if not improving in the next week to discuss referral to a GI specialist.

## 2021-06-22 NOTE — ED Triage Notes (Signed)
Pt states she has undiagnosed acid reflux. States she has thrown up twice over 2 days and has been eating spicy foods.

## 2021-06-23 ENCOUNTER — Encounter (HOSPITAL_COMMUNITY): Payer: Self-pay | Admitting: *Deleted

## 2021-06-23 ENCOUNTER — Other Ambulatory Visit: Payer: Self-pay

## 2021-06-23 ENCOUNTER — Emergency Department (HOSPITAL_COMMUNITY)
Admission: EM | Admit: 2021-06-23 | Discharge: 2021-06-23 | Disposition: A | Discharge status: Home / Self Care | Payer: Medicaid Other | Attending: Emergency Medicine | Admitting: Emergency Medicine

## 2021-06-23 DIAGNOSIS — Z20822 Contact with and (suspected) exposure to covid-19: Secondary | ICD-10-CM | POA: Diagnosis not present

## 2021-06-23 DIAGNOSIS — R519 Headache, unspecified: Secondary | ICD-10-CM | POA: Diagnosis present

## 2021-06-23 DIAGNOSIS — E860 Dehydration: Secondary | ICD-10-CM | POA: Insufficient documentation

## 2021-06-23 DIAGNOSIS — Z87891 Personal history of nicotine dependence: Secondary | ICD-10-CM | POA: Insufficient documentation

## 2021-06-23 DIAGNOSIS — R1013 Epigastric pain: Secondary | ICD-10-CM | POA: Insufficient documentation

## 2021-06-23 DIAGNOSIS — B349 Viral infection, unspecified: Secondary | ICD-10-CM | POA: Insufficient documentation

## 2021-06-23 LAB — RESP PANEL BY RT-PCR (FLU A&B, COVID) ARPGX2
Influenza A by PCR: NEGATIVE
Influenza B by PCR: NEGATIVE
SARS Coronavirus 2 by RT PCR: NEGATIVE

## 2021-06-23 MED ORDER — ONDANSETRON 4 MG PO TBDP
4.0000 mg | ORAL_TABLET | Freq: Once | ORAL | Status: AC
  Administered 2021-06-23: 4 mg via ORAL
  Filled 2021-06-23: qty 1

## 2021-06-23 MED ORDER — NAPROXEN 500 MG PO TABS
500.0000 mg | ORAL_TABLET | Freq: Two times a day (BID) | ORAL | 0 refills | Status: AC
Start: 2021-06-23 — End: ?

## 2021-06-23 MED ORDER — ONDANSETRON 4 MG PO TBDP
4.0000 mg | ORAL_TABLET | Freq: Three times a day (TID) | ORAL | 0 refills | Status: AC | PRN
Start: 2021-06-23 — End: ?

## 2021-06-23 MED ORDER — KETOROLAC TROMETHAMINE 30 MG/ML IJ SOLN
15.0000 mg | Freq: Once | INTRAMUSCULAR | Status: AC
  Administered 2021-06-23: 15 mg via INTRAMUSCULAR
  Filled 2021-06-23: qty 1

## 2021-06-23 MED ORDER — FLUTICASONE PROPIONATE 50 MCG/ACT NA SUSP
1.0000 | Freq: Every day | NASAL | 0 refills | Status: AC
Start: 2021-06-23 — End: ?

## 2021-06-23 NOTE — ED Notes (Signed)
Pt A&OX4 Ambulatory at d/c with independent steady gait, NAD/ Pt verbalized understanding of d/c instructions, prescriptions and follow up care.

## 2021-06-23 NOTE — Discharge Instructions (Signed)
You have a viral illness.  This should be treated symptomatically. Take naproxen 2 times a day with meals.  Do not take other anti-inflammatories at the same time (Advil, Motrin, ibuprofen, Aleve). You may supplement with Tylenol if you need further pain control. Use Flonase daily for nasal congestion and cough. Use zofran as needed for nausea and vomiting.  Make sure you stay well-hydrated with water. Wash your hands frequently to prevent spread of infection. Follow-up with your primary care doctor in 1 week if your symptoms are not improving. Return to the emergency room if you develop chest pain, difficulty breathing, or any new or worsening symptoms.

## 2021-06-23 NOTE — ED Notes (Addendum)
Note charted in error

## 2021-06-23 NOTE — ED Notes (Signed)
Pt c/o hiccups x 4 days non stop

## 2021-06-23 NOTE — ED Provider Notes (Signed)
[facility] EMERGENCY DEPARTMENT Provider Note   CSN: [id number] Arrival date & time: 06/23/21  1511     History Chief Complaint  Patient presents with  . Migraine    [name] [name] is a 25 y.o. female presenting for evaluation of headache, sore throat, cough, nausea.  Patient states she has not felt well for about 5 days.  She reports generalized headache, nasal congestion, sore throat, nonproductive cough, nausea, generalized body aches.  She was seen in the ER about 5 days ago, diagnosed with GERD and started on antacids.  She has been taking those without improvement of symptoms.  Has not taken anything else for symptoms.  She denies sick contacts.  She is not vaccinated for flu.  She reports a history of anxiety depression, no other medical problems.  No history of asthma.  HPI     Past Medical History:  Diagnosis Date  . 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   Past Surgical History:  Procedure Laterality Date  . MYRINGOTOMY    . TONSILLECTOMY       OB History     Gravida  2   Para  2   Term  2   Preterm      AB      Living  2      SAB      IAB      Ectopic      Multiple  0   Live Births  2           Family History  Problem Relation Age of Onset  . Cancer Maternal Grandfather        stomach, throat  . Diabetes Cousin   . Alcohol abuse Neg Hx   . Arthritis Neg Hx   . Asthma Neg Hx   . COPD Neg Hx   . Birth defects Neg Hx   . Depression Neg Hx   . Drug abuse Neg Hx   . Early death Neg Hx   . Hearing loss Neg Hx   . Heart disease Neg Hx   . Hyperlipidemia Neg Hx   . Hypertension Neg Hx   . Kidney disease Neg Hx   . Learning disabilities Neg Hx   . Mental illness Neg Hx   . Miscarriages / Stillbirths Neg Hx   . Mental retardation Neg Hx   . Vision loss Neg Hx   . Stroke Neg Hx   . Varicose Veins Neg Hx     Social History   Tobacco Use  . Smoking status: Former     Packs/day: 0.50    Years: 2.00    Pack years: 1.00    Types: Cigarettes    Quit date: 03/10/2016    Years since quitting: 5.2  . Smokeless tobacco: Never  Vaping Use  . Vaping Use: Never used  Substance Use Topics  . Alcohol use: No  . Drug use: No    Home Medications Prior to Admission medications   Medication Sig Start Date End Date Taking? Authorizing Provider  fluticasone (FLONASE) 50 MCG/ACT nasal spray Place 1 spray into both nostrils daily. 06/23/21  Yes Alexias Margerum, PA-C  naproxen (NAPROSYN) 500 MG tablet Take 1 tablet (500 mg total) by mouth 2 (two) times daily with a meal. 06/23/21  Yes Haylin Camilli, PA-C  ondansetron (ZOFRAN ODT) 4 MG disintegrating tablet Take 1 tablet (4 mg total) by mouth every 8 (eight) hours as needed  for nausea or vomiting. 06/23/21  Yes Zainah Steven, PA-C  Alum & Mag Hydroxide-Simeth (GI COCKTAIL) SUSP suspension Take 30 mLs by mouth 2 (two) times daily as needed for indigestion. Shake well. 06/22/21   Geoffery Lyons, MD  azithromycin (ZITHROMAX) 250 MG tablet Take 1 tablet (250 mg total) by mouth daily. Take first 2 tablets together, then 1 every day until finished. 01/28/20   Avegno, Zachery Dakins, FNP  benzonatate (TESSALON) 100 MG capsule Take 1-2 capsules (100-200 mg total) by mouth 3 (three) times daily as needed for cough. 06/11/21   Wallis Bamberg, PA-C  buprenorphine-naloxone (SUBOXONE) 2-0.5 mg SUBL SL tablet Place 1 tablet under the tongue daily.    [provider]  cetirizine (ZYRTEC ALLERGY) 10 MG tablet Take 1 tablet (10 mg total) by mouth daily. 06/11/21   Wallis Bamberg, PA-C  medroxyPROGESTERone (DEPO-PROVERA) 150 MG/ML injection Inject 1 mL (150 mg total) into the muscle every 3 (three) months. 04/17/20   Adline Potter, NP  pantoprazole (PROTONIX) 20 MG tablet Take 1 tablet (20 mg total) by mouth daily. 06/22/21   Geoffery Lyons, MD  PARoxetine (PAXIL) 10 MG tablet Take 10 mg by mouth daily.    [provider]   promethazine-dextromethorphan (PROMETHAZINE-DM) 6.25-15 MG/5ML syrup Take 5 mLs by mouth at bedtime as needed for cough. 06/11/21   Wallis Bamberg, PA-C  pseudoephedrine (SUDAFED) 30 MG tablet Take 1 tablet (30 mg total) by mouth every 8 (eight) hours as needed for congestion. 06/11/21   Wallis Bamberg, PA-C  sucralfate (CARAFATE) 1 g tablet Take 1 tablet (1 g total) by mouth 4 (four) times daily -  with meals and at bedtime. 06/22/21   Geoffery Lyons, MD    Allergies    Patient has no known allergies.  Review of Systems   Review of Systems  Constitutional:  Positive for fever (subjective).  HENT:  Positive for congestion and sore throat.   Respiratory:  Positive for cough.   Gastrointestinal:  Positive for nausea.  All other systems reviewed and are negative.  Physical Exam Updated Vital Signs BP 99/71   Pulse 67   Temp 99 F (37.2 C) (Oral)   Resp 18   SpO2 98%   Physical Exam Vitals and nursing note reviewed.  Constitutional:      General: She is not in acute distress.    Appearance: Normal appearance.     Comments: Resting in the bed in no acute distress  HENT:     Head: Normocephalic and atraumatic.  Eyes:     Extraocular Movements: Extraocular movements intact.     Conjunctiva/sclera: Conjunctivae normal.     Pupils: Pupils are equal, round, and reactive to light.  Cardiovascular:     Rate and Rhythm: Normal rate and regular rhythm.     Pulses: Normal pulses.  Pulmonary:     Effort: Pulmonary effort is normal. No respiratory distress.     Breath sounds: Normal breath sounds. No wheezing.     Comments: Speaking in full sentences.  Clear lung sounds in all fields. Abdominal:     General: There is no distension.     Palpations: Abdomen is soft. There is no mass.     Tenderness: There is no abdominal tenderness. There is no guarding or rebound.  Musculoskeletal:        General: Normal range of motion.     Cervical back: Normal range of motion and neck supple.  Skin:     General: Skin is warm and  dry.     Capillary Refill: Capillary refill takes less than 2 seconds.  Neurological:     Mental Status: She is alert and oriented to person, place, and time.  Psychiatric:        Mood and Affect: Mood and affect normal.        Speech: Speech normal.        Behavior: Behavior normal.    ED Results / Procedures / Treatments   Labs (all labs ordered are listed, but only abnormal results are displayed) Labs Reviewed  RESP PANEL BY RT-PCR (FLU A&B, COVID) ARPGX2    EKG None  Radiology No results found.  Procedures Procedures   Medications Ordered in ED Medications  ondansetron (ZOFRAN-ODT) disintegrating tablet 4 mg (4 mg Oral Given 06/23/21 1718)  ketorolac (TORADOL) 30 MG/ML injection 15 mg (15 mg Intramuscular Given 06/23/21 1719)    ED Course  I have reviewed the triage vital signs and the nursing notes.  Pertinent labs & imaging results that were available during my care of the patient were reviewed by me and considered in my medical decision making (see chart for details).    MDM Rules/Calculators/A&P                           Patient presenting with 5 day h/o URI symptoms.  Physical exam reassuring, patient is afebrile and appears nontoxic.  Pulmonary exam reassuring.  Doubt pneumonia, strep, other bacterial infection, or peritonsillar abscess. Covid and flu test negative.  Likely other viral URI.  Will treat symptomatically.  Patient to follow-up with primary care as needed.  At this time, patient appears safe for discharge.  Return precautions given.  Patient states she understands and agrees to plan.  Final Clinical Impression(s) / ED Diagnoses Final diagnoses:  Viral illness    Rx / DC Orders ED Discharge Orders          Ordered    ondansetron (ZOFRAN ODT) 4 MG disintegrating tablet  Every 8 hours PRN        06/23/21 1817    naproxen (NAPROSYN) 500 MG tablet  2 times daily with meals        06/23/21 1817    fluticasone  (FLONASE) 50 MCG/ACT nasal spray  Daily        06/23/21 1817             Alveria Apley, PA-C 06/23/21 1911    LongArlyss Repress, MD 06/24/21 1054

## 2021-06-23 NOTE — ED Triage Notes (Signed)
Patient has multiple symptoms, migraines, throat pain, nausea

## 2021-06-23 NOTE — ED Triage Notes (Signed)
Pt vomiting red emesis; pt states she is having body aches and feels weak;

## 2021-06-23 NOTE — ED Notes (Signed)
Pt ambulatory to room with independent steady gait 

## 2021-06-24 ENCOUNTER — Emergency Department (HOSPITAL_COMMUNITY)
Admission: EM | Admit: 2021-06-24 | Discharge: 2021-06-24 | Disposition: A | Discharge status: Home / Self Care | Payer: Medicaid Other | Source: Home / Self Care | Attending: Emergency Medicine | Admitting: Emergency Medicine

## 2021-06-24 DIAGNOSIS — B349 Viral infection, unspecified: Secondary | ICD-10-CM

## 2021-06-24 LAB — URINALYSIS, ROUTINE W REFLEX MICROSCOPIC
Bilirubin Urine: NEGATIVE
Glucose, UA: NEGATIVE mg/dL
Hgb urine dipstick: NEGATIVE
Ketones, ur: 20 mg/dL — AB
Leukocytes,Ua: NEGATIVE
Nitrite: NEGATIVE
Protein, ur: 30 mg/dL — AB
Specific Gravity, Urine: 1.029 (ref 1.005–1.030)
pH: 5 (ref 5.0–8.0)

## 2021-06-24 LAB — PREGNANCY, URINE: Preg Test, Ur: NEGATIVE

## 2021-06-24 MED ORDER — ONDANSETRON 4 MG PO TBDP
4.0000 mg | ORAL_TABLET | Freq: Once | ORAL | Status: AC
  Administered 2021-06-24: 4 mg via ORAL
  Filled 2021-06-24: qty 1

## 2021-06-24 MED ORDER — ACETAMINOPHEN 500 MG PO TABS
1000.0000 mg | ORAL_TABLET | Freq: Once | ORAL | Status: AC
  Administered 2021-06-24: 1000 mg via ORAL
  Filled 2021-06-24: qty 2

## 2021-06-24 MED ORDER — SUCRALFATE 1 GM/10ML PO SUSP
1.0000 g | Freq: Three times a day (TID) | ORAL | Status: DC
  Administered 2021-06-24: 1 g via ORAL
  Filled 2021-06-24: qty 10

## 2021-06-24 NOTE — Discharge Instructions (Signed)
You were seen today for ongoing upper respiratory symptoms, headache, emesis.  You do not show signs or symptoms of dehydration.  Your previous testing was reassuring.  Continue your acid reducing medications at home.  Will be discharged with nausea medicine.  Take Tylenol for your headache.

## 2021-06-24 NOTE — ED Provider Notes (Signed)
[facility] EMERGENCY DEPARTMENT Provider Note   CSN: [id number] Arrival date & time: 06/23/21  2141     History Chief Complaint  Patient presents with   Emesis    [name] [name] is a 25 y.o. female.  HPI     This is a 25 year old female who presents with persistent headache and emesis.  Patient reports she has not felt well in several days.  She reports multiple symptoms including headache, nausea, vomiting, sore throat, cough.  She was seen and evaluated earlier yesterday evening for the same.  At that time she was COVID and flu negative.  She was discharged with Zofran.  She was also evaluated approximately 1 week ago and was diagnosed with reflux.  She was started on antacids.  She states she has had persistent epigastric discomfort and nausea and vomiting related to her reflux.  After leaving the hospital yesterday she had an episode of emesis that was bloody and this concerned her.  She not on any blood thinners.  She has not had any fevers.  She does report myalgias and chills.  She states that her headache improved while in the emergency department but "it came back and now is present again."  She rates her pain at 3 out of 10.  No vision changes, weakness, numbness, tingling.  Past Medical History:  Diagnosis Date   Postpartum depression 02/19/2014    There are no problems to display for this patient.   Past Surgical History:  Procedure Laterality Date   MYRINGOTOMY     TONSILLECTOMY       OB History     Gravida  2   Para  2   Term  2   Preterm      AB      Living  2      SAB      IAB      Ectopic      Multiple  0   Live Births  2           Family History  Problem Relation Age of Onset   Cancer Maternal Grandfather        stomach, throat   Diabetes Cousin    Alcohol abuse Neg Hx    Arthritis Neg Hx    Asthma Neg Hx    COPD Neg Hx    Birth defects Neg Hx    Depression Neg Hx    Drug abuse Neg Hx    Early death Neg Hx     Hearing loss Neg Hx    Heart disease Neg Hx    Hyperlipidemia Neg Hx    Hypertension Neg Hx    Kidney disease Neg Hx    Learning disabilities Neg Hx    Mental illness Neg Hx    Miscarriages / Stillbirths Neg Hx    Mental retardation Neg Hx    Vision loss Neg Hx    Stroke Neg Hx    Varicose Veins Neg Hx     Social History   Tobacco Use   Smoking status: Former    Packs/day: 0.50    Years: 2.00    Pack years: 1.00    Types: Cigarettes    Quit date: 03/10/2016    Years since quitting: 5.2   Smokeless tobacco: Never  Vaping Use   Vaping Use: Never used  Substance Use Topics   Alcohol use: No   Drug use: No    Home Medications Prior to Admission medications  Medication Sig Start Date End Date Taking? Authorizing Provider  Alum & Mag Hydroxide-Simeth (GI COCKTAIL) SUSP suspension Take 30 mLs by mouth 2 (two) times daily as needed for indigestion. Shake well. 06/22/21   Geoffery Lyons, MD  azithromycin (ZITHROMAX) 250 MG tablet Take 1 tablet (250 mg total) by mouth daily. Take first 2 tablets together, then 1 every day until finished. 01/28/20   Avegno, Zachery Dakins, FNP  benzonatate (TESSALON) 100 MG capsule Take 1-2 capsules (100-200 mg total) by mouth 3 (three) times daily as needed for cough. 06/11/21   Wallis Bamberg, PA-C  buprenorphine-naloxone (SUBOXONE) 2-0.5 mg SUBL SL tablet Place 1 tablet under the tongue daily.    [provider]  cetirizine (ZYRTEC ALLERGY) 10 MG tablet Take 1 tablet (10 mg total) by mouth daily. 06/11/21   Wallis Bamberg, PA-C  fluticasone (FLONASE) 50 MCG/ACT nasal spray Place 1 spray into both nostrils daily. 06/23/21   Caccavale, Sophia, PA-C  medroxyPROGESTERone (DEPO-PROVERA) 150 MG/ML injection Inject 1 mL (150 mg total) into the muscle every 3 (three) months. 04/17/20   Adline Potter, NP  naproxen (NAPROSYN) 500 MG tablet Take 1 tablet (500 mg total) by mouth 2 (two) times daily with a meal. 06/23/21   Caccavale, Sophia, PA-C  ondansetron  (ZOFRAN ODT) 4 MG disintegrating tablet Take 1 tablet (4 mg total) by mouth every 8 (eight) hours as needed for nausea or vomiting. 06/23/21   Caccavale, Sophia, PA-C  pantoprazole (PROTONIX) 20 MG tablet Take 1 tablet (20 mg total) by mouth daily. 06/22/21   Geoffery Lyons, MD  PARoxetine (PAXIL) 10 MG tablet Take 10 mg by mouth daily.    [provider]  promethazine-dextromethorphan (PROMETHAZINE-DM) 6.25-15 MG/5ML syrup Take 5 mLs by mouth at bedtime as needed for cough. 06/11/21   Wallis Bamberg, PA-C  pseudoephedrine (SUDAFED) 30 MG tablet Take 1 tablet (30 mg total) by mouth every 8 (eight) hours as needed for congestion. 06/11/21   Wallis Bamberg, PA-C  sucralfate (CARAFATE) 1 g tablet Take 1 tablet (1 g total) by mouth 4 (four) times daily -  with meals and at bedtime. 06/22/21   Geoffery Lyons, MD    Allergies    Patient has no known allergies.  Review of Systems   Review of Systems  Constitutional:  Positive for chills. Negative for fever.  HENT:  Positive for congestion and sinus pain.   Respiratory:  Positive for cough. Negative for shortness of breath.   Cardiovascular:  Negative for chest pain.  Gastrointestinal:  Positive for abdominal pain, nausea and vomiting.  Genitourinary:  Negative for dysuria.  Neurological:  Positive for headaches. Negative for weakness and numbness.  All other systems reviewed and are negative.  Physical Exam Updated Vital Signs BP 118/88 (BP Location: Right Arm)   Pulse 80   Temp 97.7 F (36.5 C) (Oral)   Resp 18   Ht 1.6 m (5\' 3" )   Wt 48.5 kg   SpO2 100%   BMI 18.95 kg/m   Physical Exam Vitals and nursing note reviewed.  Constitutional:      Appearance: She is well-developed. She is not ill-appearing.  HENT:     Head: Normocephalic and atraumatic.     Nose: Congestion present.     Mouth/Throat:     Mouth: Mucous membranes are moist.  Eyes:     Pupils: Pupils are equal, round, and reactive to light.  Cardiovascular:     Rate  and Rhythm: Normal rate and regular rhythm.  Heart sounds: Normal heart sounds.  Pulmonary:     Effort: Pulmonary effort is normal. No respiratory distress.     Breath sounds: No wheezing.  Abdominal:     General: Bowel sounds are normal.     Palpations: Abdomen is soft.     Tenderness: There is no abdominal tenderness. There is no guarding or rebound.  Musculoskeletal:     Cervical back: Neck supple.     Right lower leg: No edema.     Left lower leg: No edema.  Skin:    General: Skin is warm and dry.  Neurological:     Mental Status: She is alert and oriented to person, place, and time.     Comments: Cranial nerves II through XII intact, fluent speech  Psychiatric:        Mood and Affect: Mood normal.    ED Results / Procedures / Treatments   Labs (all labs ordered are listed, but only abnormal results are displayed) Labs Reviewed  URINALYSIS, ROUTINE W REFLEX MICROSCOPIC - Abnormal; Notable for the following components:      Result Value   APPearance HAZY (*)    Ketones, ur 20 (*)    Protein, ur 30 (*)    Bacteria, UA RARE (*)    All other components within normal limits  PREGNANCY, URINE    EKG None  Radiology No results found.  Procedures Procedures   Medications Ordered in ED Medications  sucralfate (CARAFATE) 1 GM/10ML suspension 1 g (1 g Oral Given 06/24/21 0107)  ondansetron (ZOFRAN-ODT) disintegrating tablet 4 mg (4 mg Oral Given 06/24/21 0107)  acetaminophen (TYLENOL) tablet 1,000 mg (1,000 mg Oral Given 06/24/21 0107)    ED Course  I have reviewed the triage vital signs and the nursing notes.  Pertinent labs & imaging results that were available during my care of the patient were reviewed by me and considered in my medical decision making (see chart for details).    MDM Rules/Calculators/A&P                           Patient presents with ongoing upper respiratory symptoms, headache, epigastric discomfort and vomiting.  She is nontoxic and  vital signs are reassuring.  Her physical exam is fairly benign.  She appears well-hydrated.  She has no reproducible abdominal tenderness.  I have reviewed her chart.  Recent diagnosis of reflux and a viral illness.  She was COVID and flu negative yesterday evening.  I have sent a urinalysis to assess for dehydration and a urine pregnancy test.  Both these are fairly reassuring.  She does have 20 ketones which may reflect some mild dehydration.  Highly suspect viral etiology.  Discussed with patient that her reflux medications may not begin to work until 7 to 14 days.  She should continue these at home.  Take Zofran as directed.  Regarding the blood in her vomit, patient has not had any recurrent emesis.  Suspect she may have had a small Mallory-Weiss tear as she is otherwise low risk.  After history, exam, and medical workup I feel the patient has been appropriately medically screened and is safe for discharge home. Pertinent diagnoses were discussed with the patient. Patient was given return precautions.  Final Clinical Impression(s) / ED Diagnoses Final diagnoses:  Viral illness    Rx / DC Orders ED Discharge Orders     None        Sammye Staff, Mayer Masker, MD  06/24/21 0135  

## 2021-08-08 ENCOUNTER — Ambulatory Visit
Admission: EM | Admit: 2021-08-08 | Discharge: 2021-08-08 | Disposition: A | Discharge status: Home / Self Care | Payer: Medicaid Other | Attending: Family Medicine | Admitting: Family Medicine

## 2021-08-08 ENCOUNTER — Other Ambulatory Visit: Payer: Self-pay

## 2021-08-08 DIAGNOSIS — J029 Acute pharyngitis, unspecified: Secondary | ICD-10-CM | POA: Diagnosis not present

## 2021-08-08 DIAGNOSIS — J069 Acute upper respiratory infection, unspecified: Secondary | ICD-10-CM

## 2021-08-08 LAB — POCT RAPID STREP A (OFFICE): Rapid Strep A Screen: NEGATIVE

## 2021-08-08 MED ORDER — PREDNISONE 20 MG PO TABS
40.0000 mg | ORAL_TABLET | Freq: Every day | ORAL | 0 refills | Status: AC
Start: 2021-08-08 — End: ?

## 2021-08-08 MED ORDER — LIDOCAINE VISCOUS HCL 2 % MT SOLN
10.0000 mL | OROMUCOSAL | 0 refills | Status: AC | PRN
Start: 2021-08-08 — End: ?

## 2021-08-08 NOTE — ED Triage Notes (Signed)
Patient states she has a very sore throat  She states she feels like she has had a fever  She states she is feeling weak.   She states she is having a stuffy nose  Denies Exposure   Denies Meds

## 2021-08-08 NOTE — ED Provider Notes (Signed)
[facility]    CSN: [id number] Arrival date & time: 08/08/21  1627      History   Chief Complaint Chief Complaint  Patient presents with   Sore Throat    Sore throat with bumps in back of throat   HPI [name] [name] Shown is a 25 y.o. female.   Presenting today with 1 to 2-day history of sore, swollen feeling throat, weakness, fatigue, nasal congestion, chills, sweats.  Denies chest pain, shortness of breath, abdominal pain, nausea vomiting or diarrhea.  So far not trying any medications for symptoms.  Multiple sick contacts recently.  No known pertinent chronic medical problems.  Past Medical History:  Diagnosis Date   Postpartum depression 02/19/2014   There are no problems to display for this patient.  Past Surgical History:  Procedure Laterality Date   MYRINGOTOMY     TONSILLECTOMY     OB History     Gravida  2   Para  2   Term  2   Preterm      AB      Living  2      SAB      IAB      Ectopic      Multiple  0   Live Births  2           Home Medications    Prior to Admission medications   Medication Sig Start Date End Date Taking? Authorizing Provider  lidocaine (XYLOCAINE) 2 % solution Use as directed 10 mLs in the mouth or throat every 3 (three) hours as needed for mouth pain. 08/08/21  Yes Particia Nearing, PA-C  predniSONE (DELTASONE) 20 MG tablet Take 2 tablets (40 mg total) by mouth daily with breakfast. 08/08/21  Yes Particia Nearing, PA-C  Alum & Mag Hydroxide-Simeth (GI COCKTAIL) SUSP suspension Take 30 mLs by mouth 2 (two) times daily as needed for indigestion. Shake well. 06/22/21   Geoffery Lyons, MD  azithromycin (ZITHROMAX) 250 MG tablet Take 1 tablet (250 mg total) by mouth daily. Take first 2 tablets together, then 1 every day until finished. 01/28/20   Avegno, Zachery Dakins, FNP  benzonatate (TESSALON) 100 MG capsule Take 1-2 capsules (100-200 mg total) by mouth 3 (three) times daily as needed for cough.  06/11/21   Wallis Bamberg, PA-C  buprenorphine-naloxone (SUBOXONE) 2-0.5 mg SUBL SL tablet Place 1 tablet under the tongue daily.    [provider]  cetirizine (ZYRTEC ALLERGY) 10 MG tablet Take 1 tablet (10 mg total) by mouth daily. 06/11/21   Wallis Bamberg, PA-C  fluticasone (FLONASE) 50 MCG/ACT nasal spray Place 1 spray into both nostrils daily. 06/23/21   Caccavale, Sophia, PA-C  medroxyPROGESTERone (DEPO-PROVERA) 150 MG/ML injection Inject 1 mL (150 mg total) into the muscle every 3 (three) months. 04/17/20   Adline Potter, NP  naproxen (NAPROSYN) 500 MG tablet Take 1 tablet (500 mg total) by mouth 2 (two) times daily with a meal. 06/23/21   Caccavale, Sophia, PA-C  ondansetron (ZOFRAN ODT) 4 MG disintegrating tablet Take 1 tablet (4 mg total) by mouth every 8 (eight) hours as needed for nausea or vomiting. 06/23/21   Caccavale, Sophia, PA-C  pantoprazole (PROTONIX) 20 MG tablet Take 1 tablet (20 mg total) by mouth daily. 06/22/21   Geoffery Lyons, MD  PARoxetine (PAXIL) 10 MG tablet Take 10 mg by mouth daily.    [provider]  promethazine-dextromethorphan (PROMETHAZINE-DM) 6.25-15 MG/5ML syrup Take 5 mLs by mouth at bedtime  as needed for cough. 06/11/21   Wallis Bamberg, PA-C  pseudoephedrine (SUDAFED) 30 MG tablet Take 1 tablet (30 mg total) by mouth every 8 (eight) hours as needed for congestion. 06/11/21   Wallis Bamberg, PA-C  sucralfate (CARAFATE) 1 g tablet Take 1 tablet (1 g total) by mouth 4 (four) times daily -  with meals and at bedtime. 06/22/21   Geoffery Lyons, MD   Family History Family History  Problem Relation Age of Onset   Cancer Maternal Grandfather        stomach, throat   Diabetes Cousin    Alcohol abuse Neg Hx    Arthritis Neg Hx    Asthma Neg Hx    COPD Neg Hx    Birth defects Neg Hx    Depression Neg Hx    Drug abuse Neg Hx    Early death Neg Hx    Hearing loss Neg Hx    Heart disease Neg Hx    Hyperlipidemia Neg Hx    Hypertension Neg Hx     Kidney disease Neg Hx    Learning disabilities Neg Hx    Mental illness Neg Hx    Miscarriages / Stillbirths Neg Hx    Mental retardation Neg Hx    Vision loss Neg Hx    Stroke Neg Hx    Varicose Veins Neg Hx     Social History Social History   Tobacco Use   Smoking status: Every Day    Packs/day: 0.50    Years: 2.00    Pack years: 1.00    Types: Cigarettes    Last attempt to quit: 03/10/2016    Years since quitting: 5.4   Smokeless tobacco: Never  Vaping Use   Vaping Use: Never used  Substance Use Topics   Alcohol use: No   Drug use: No     Allergies   Patient has no known allergies.   Review of Systems Review of Systems Per HPI  Physical Exam Triage Vital Signs ED Triage Vitals  Enc Vitals Group     BP 08/08/21 1811 110/71     Pulse Rate 08/08/21 1811 (!) 103     Resp 08/08/21 1811 18     Temp 08/08/21 1811 98.3 F (36.8 C)     Temp Source 08/08/21 1811 Oral     SpO2 08/08/21 1811 97 %     Weight --      Height --      Head Circumference --      Peak Flow --      Pain Score 08/08/21 1819 9     Pain Loc --      Pain Edu? --      Excl. in GC? --    No data found.  Updated Vital Signs BP 110/71 (BP Location: Right Arm)    Pulse (!) 103    Temp 98.3 F (36.8 C) (Oral)    Resp 18    SpO2 97%   Visual Acuity Right Eye Distance:   Left Eye Distance:   Bilateral Distance:    Right Eye Near:   Left Eye Near:    Bilateral Near:     Physical Exam Vitals and nursing note reviewed.  Constitutional:      Appearance: Normal appearance.  HENT:     Head: Atraumatic.     Right Ear: Tympanic membrane and external ear normal.     Left Ear: Tympanic membrane and external ear normal.     Nose:  Rhinorrhea present.     Mouth/Throat:     Mouth: Mucous membranes are moist.     Pharynx: Posterior oropharyngeal erythema present. No oropharyngeal exudate.     Comments: No tonsillar edema, exudates and uvula midline, oral airway patent Eyes:     Extraocular  Movements: Extraocular movements intact.     Conjunctiva/sclera: Conjunctivae normal.  Cardiovascular:     Rate and Rhythm: Normal rate and regular rhythm.     Heart sounds: Normal heart sounds.  Pulmonary:     Effort: Pulmonary effort is normal.     Breath sounds: Normal breath sounds. No wheezing or rales.  Abdominal:     General: Bowel sounds are normal. There is no distension.     Palpations: Abdomen is soft.     Tenderness: There is no abdominal tenderness. There is no guarding.  Musculoskeletal:        General: Normal range of motion.     Cervical back: Normal range of motion and neck supple.  Skin:    General: Skin is warm and dry.  Neurological:     Mental Status: She is alert and oriented to person, place, and time.     Motor: No weakness.     Gait: Gait normal.  Psychiatric:        Mood and Affect: Mood normal.        Thought Content: Thought content normal.     UC Treatments / Results  Labs (all labs ordered are listed, but only abnormal results are displayed) Labs Reviewed  CULTURE, GROUP A STREP ([facility])  COVID-19, FLU A+B NAA  POCT RAPID STREP A (OFFICE)    EKG   Radiology No results found.  Procedures Procedures (including critical care time)  Medications Ordered in UC Medications - No data to display  Initial Impression / Assessment and Plan / UC Course  I have reviewed the triage vital signs and the nursing notes.  Pertinent labs & imaging results that were available during my care of the patient were reviewed by me and considered in my medical decision making (see chart for details).     Minimally tachycardic in triage, otherwise vital signs reassuring.  Suspect viral illness causing symptoms.  Rapid strep negative, throat culture and COVID and flu testing pending.  We will treat with viscous lidocaine, short burst of prednisone for symptomatic benefit and discussed over-the-counter supportive medications and home care additionally.  Return for  acutely worsening symptoms.  Final Clinical Impressions(s) / UC Diagnoses   Final diagnoses:  Sore throat  Viral URI   Discharge Instructions   None    ED Prescriptions     Medication Sig Dispense Auth. Provider   lidocaine (XYLOCAINE) 2 % solution Use as directed 10 mLs in the mouth or throat every 3 (three) hours as needed for mouth pain. 100 mL Particia Nearing, PA-C   predniSONE (DELTASONE) 20 MG tablet Take 2 tablets (40 mg total) by mouth daily with breakfast. 6 tablet Particia Nearing, [place]      PDMP not reviewed this encounter.   Particia Nearing, [place] 08/08/21 1913

## 2021-08-10 LAB — CULTURE, GROUP A STREP (THRC)

## 2021-08-10 LAB — COVID-19, FLU A+B NAA
Influenza A, NAA: NOT DETECTED
Influenza B, NAA: NOT DETECTED
SARS-CoV-2, NAA: NOT DETECTED

## 2021-08-19 ENCOUNTER — Other Ambulatory Visit: Payer: Self-pay

## 2021-08-19 ENCOUNTER — Encounter (HOSPITAL_COMMUNITY): Payer: Self-pay | Admitting: Emergency Medicine

## 2021-08-19 DIAGNOSIS — Z85819 Personal history of malignant neoplasm of unspecified site of lip, oral cavity, and pharynx: Secondary | ICD-10-CM | POA: Diagnosis not present

## 2021-08-19 DIAGNOSIS — J45909 Unspecified asthma, uncomplicated: Secondary | ICD-10-CM | POA: Diagnosis not present

## 2021-08-19 DIAGNOSIS — I10 Essential (primary) hypertension: Secondary | ICD-10-CM | POA: Diagnosis not present

## 2021-08-19 DIAGNOSIS — J449 Chronic obstructive pulmonary disease, unspecified: Secondary | ICD-10-CM | POA: Diagnosis not present

## 2021-08-19 DIAGNOSIS — R112 Nausea with vomiting, unspecified: Secondary | ICD-10-CM | POA: Diagnosis not present

## 2021-08-19 DIAGNOSIS — E119 Type 2 diabetes mellitus without complications: Secondary | ICD-10-CM | POA: Diagnosis not present

## 2021-08-19 DIAGNOSIS — Z85028 Personal history of other malignant neoplasm of stomach: Secondary | ICD-10-CM | POA: Diagnosis not present

## 2021-08-19 DIAGNOSIS — F17210 Nicotine dependence, cigarettes, uncomplicated: Secondary | ICD-10-CM | POA: Insufficient documentation

## 2021-08-19 NOTE — ED Triage Notes (Addendum)
Pt with emesis x 3 days. States she cannot keep anything down. States she has a bad headache as well as panic attacks. States her fiancee of 5 yrs just broke up with her.

## 2021-08-20 ENCOUNTER — Emergency Department (HOSPITAL_COMMUNITY)
Admission: EM | Admit: 2021-08-20 | Discharge: 2021-08-20 | Disposition: A | Discharge status: Home / Self Care | Payer: Medicaid Other | Attending: Emergency Medicine | Admitting: Emergency Medicine

## 2021-08-20 DIAGNOSIS — R112 Nausea with vomiting, unspecified: Secondary | ICD-10-CM

## 2021-08-20 HISTORY — DX: Anxiety disorder, unspecified: F41.9

## 2021-08-20 LAB — CBC
HCT: 40.6 % (ref 36.0–46.0)
Hemoglobin: 13.5 g/dL (ref 12.0–15.0)
MCH: 31.2 pg (ref 26.0–34.0)
MCHC: 33.3 g/dL (ref 30.0–36.0)
MCV: 93.8 fL (ref 80.0–100.0)
Platelets: 343 10*3/uL (ref 150–400)
RBC: 4.33 MIL/uL (ref 3.87–5.11)
RDW: 12.3 % (ref 11.5–15.5)
WBC: 8.1 10*3/uL (ref 4.0–10.5)
nRBC: 0 % (ref 0.0–0.2)

## 2021-08-20 LAB — COMPREHENSIVE METABOLIC PANEL
ALT: 13 U/L (ref 0–44)
AST: 22 U/L (ref 15–41)
Albumin: 5.1 g/dL — ABNORMAL HIGH (ref 3.5–5.0)
Alkaline Phosphatase: 67 U/L (ref 38–126)
Anion gap: 11 (ref 5–15)
BUN: 16 mg/dL (ref 6–20)
CO2: 28 mmol/L (ref 22–32)
Calcium: 9.6 mg/dL (ref 8.9–10.3)
Chloride: 95 mmol/L — ABNORMAL LOW (ref 98–111)
Creatinine, Ser: 0.62 mg/dL (ref 0.44–1.00)
GFR, Estimated: >60 mL/min (ref >60)
Glucose, Bld: 100 mg/dL — ABNORMAL HIGH (ref 70–99)
Potassium: 3.4 mmol/L — ABNORMAL LOW (ref 3.5–5.1)
Sodium: 134 mmol/L — ABNORMAL LOW (ref 135–145)
Total Bilirubin: 0.6 mg/dL (ref 0.3–1.2)
Total Protein: 8.6 g/dL — ABNORMAL HIGH (ref 6.5–8.1)

## 2021-08-20 LAB — LIPASE, BLOOD: Lipase: 29 U/L (ref 11–51)

## 2021-08-20 MED ORDER — DIPHENHYDRAMINE HCL 50 MG/ML IJ SOLN
25.0000 mg | Freq: Once | INTRAMUSCULAR | Status: AC
  Administered 2021-08-20: 25 mg via INTRAVENOUS
  Filled 2021-08-20: qty 1

## 2021-08-20 MED ORDER — PROMETHAZINE HCL 25 MG PO TABS
25.0000 mg | ORAL_TABLET | Freq: Four times a day (QID) | ORAL | 0 refills | Status: AC | PRN
Start: 2021-08-20 — End: ?

## 2021-08-20 MED ORDER — PROCHLORPERAZINE EDISYLATE 10 MG/2ML IJ SOLN
10.0000 mg | Freq: Once | INTRAMUSCULAR | Status: AC
  Administered 2021-08-20: 10 mg via INTRAVENOUS
  Filled 2021-08-20: qty 2

## 2021-08-20 MED ORDER — LACTATED RINGERS IV BOLUS
1000.0000 mL | Freq: Once | INTRAVENOUS | Status: AC
  Administered 2021-08-20: 1000 mL via INTRAVENOUS

## 2021-08-20 NOTE — Discharge Instructions (Addendum)
You were evaluated in the Emergency Department and after careful evaluation, we did not find any emergent condition requiring admission or further testing in the hospital.  Your exam/testing today was overall reassuring.  Recommend close follow-up with your regular doctors to further discuss your symptoms.  Can use the Phenergan provided for nausea.  Please return to the Emergency Department if you experience any worsening of your condition.  Thank you for allowing [place] to be a part of your care.

## 2021-08-20 NOTE — ED Provider Notes (Signed)
[facility] Emergency Department Provider Note MRN:  [id number]  Arrival date & time: 08/20/21     Chief Complaint   Emesis   History of Present Illness   [name] [name] Shown is a 26 y.o. year-old female with a history of anxiety and depression presenting to the ED with chief complaint of emesis.  3 days of persistent nausea and vomiting.  Denies any significant abdominal pain.  No fever, no diarrhea or constipation.  Explains this is happened on one other occasion without known cause.  She is also going through a lot of stress and anxiety recently, broke up with her fianc, having occasional panic attacks as well.  Denies chest pain or shortness of breath.  No vaginal bleeding or discharge.  Review of Systems  A thorough review of systems was obtained and all systems are negative except as noted in the HPI and PMH.   Patient's Health History    Past Medical History:  Diagnosis Date   Anxiety    Postpartum depression 02/19/2014    Past Surgical History:  Procedure Laterality Date   MYRINGOTOMY     TONSILLECTOMY      Family History  Problem Relation Age of Onset   Cancer Maternal Grandfather        stomach, throat   Diabetes Cousin    Alcohol abuse Neg Hx    Arthritis Neg Hx    Asthma Neg Hx    COPD Neg Hx    Birth defects Neg Hx    Depression Neg Hx    Drug abuse Neg Hx    Early death Neg Hx    Hearing loss Neg Hx    Heart disease Neg Hx    Hyperlipidemia Neg Hx    Hypertension Neg Hx    Kidney disease Neg Hx    Learning disabilities Neg Hx    Mental illness Neg Hx    Miscarriages / Stillbirths Neg Hx    Mental retardation Neg Hx    Vision loss Neg Hx    Stroke Neg Hx    Varicose Veins Neg Hx     Social History   Socioeconomic History   Marital status: Single    Spouse name: Not on file   Number of children: Not on file   Years of education: Not on file   Highest education level: Not on file  Occupational History   Not on file   Tobacco Use   Smoking status: Every Day    Packs/day: 0.50    Years: 2.00    Pack years: 1.00    Types: Cigarettes    Last attempt to quit: 03/10/2016    Years since quitting: 5.4   Smokeless tobacco: Never  Vaping Use   Vaping Use: Never used  Substance and Sexual Activity   Alcohol use: No   Drug use: No   Sexual activity: Yes  Other Topics Concern   Not on file  Social History Narrative   Not on file   Social Determinants of Health   Financial Resource Strain: Not on file  Food Insecurity: Not on file  Transportation Needs: Not on file  Physical Activity: Not on file  Stress: Not on file  Social Connections: Not on file  Intimate Partner Violence: Not on file     Physical Exam   Vitals:   08/20/21 0200 08/20/21 0300  BP: 108/70 104/73  Pulse: 91 89  Resp: 16 16  Temp:    SpO2: 100% 100%  CONSTITUTIONAL: Well-appearing, NAD NEURO:  Alert and oriented x 3, no focal deficits EYES:  eyes equal and reactive ENT/NECK:  no LAD, no JVD CARDIO: Regular rate, well-perfused, normal S1 and S2 PULM:  CTAB no wheezing or rhonchi GI/GU:  non-distended, non-tender MSK/SPINE:  No gross deformities, no edema SKIN:  no rash, atraumatic   *Additional and/or pertinent findings included in MDM below  Diagnostic and Interventional Summary    EKG Interpretation  Date/Time:    Ventricular Rate:    PR Interval:    QRS Duration:   QT Interval:    QTC Calculation:   R Axis:     Text Interpretation:         Labs Reviewed  COMPREHENSIVE METABOLIC PANEL - Abnormal; Notable for the following components:      Result Value   Sodium 134 (*)    Potassium 3.4 (*)    Chloride 95 (*)    Glucose, Bld 100 (*)    Total Protein 8.6 (*)    Albumin 5.1 (*)    All other components within normal limits  LIPASE, BLOOD  CBC  URINALYSIS, ROUTINE W REFLEX MICROSCOPIC  POC URINE PREG, ED    No orders to display    Medications  lactated ringers bolus 1,000 mL (0 mLs  Intravenous Stopped 08/20/21 0217)  prochlorperazine (COMPAZINE) injection 10 mg (10 mg Intravenous Given 08/20/21 0117)  diphenhydrAMINE (BENADRYL) injection 25 mg (25 mg Intravenous Given 08/20/21 0118)     Procedures  /  Critical Care Procedures  ED Course and Medical Decision Making  Initial Impression and Ddx Patient is sitting comfortably with normal vital signs, she has a completely soft and nontender abdomen with no rebound guarding or rigidity.  Overall doubt emergent process, though patient could have some dehydration given the reported lack of p.o. intake.  We will check labs, kidney function, provide fluids, symptomatic management and reassess.  Currently without indication for imaging at this time.  Past medical/surgical history that increases complexity of ED encounter: Anxiety/depression  Interpretation of Diagnostics  Clinical Course as of 08/20/21 0335  Wed Aug 20, 2021  0152 Creatinine: 0.62 [MB]  0152 Sodium(!): 134 [MB]  0152 Potassium(!): 3.4 [MB]  0152 Chloride(!): 95 [MB]  0152 Lipase: 29 [MB]  0152 WBC: 8.1 [MB]  0153 Hemoglobin: 13.5 [MB]  0153 Platelets: 343 [MB]    Clinical Course User Index [MB] Maudie Flakes, MD    Labs overall reassuring, no AKI, no leukocytosis.  Patient Reassessment and Ultimate Disposition/Management Patient is feeling much better after medications listed above.  She denies any dysuria or hematuria, has no suprapubic tenderness, no fever and so urinalysis is not necessary at this time.  I recommended pregnancy testing but patient declines at this time, stating that she is not sexually active and there is no way she is pregnant.  Appropriate for discharge.  Patient management required discussion with the following services or consulting groups:  None  Complexity of Problems Addressed Acute complicated illness or Injury  Additional Data Reviewed and Analyzed Further history obtained from: Prior ED visit notes  Patient  Encounter Risk Assessment Moderate:  Prescriptions  Barth Kirks. Sedonia Small, MD Haivana Nakya [email or web address]  Final Clinical Impressions(s) / ED Diagnoses     ICD-10-CM   1. Nausea and vomiting, unspecified vomiting type  R11.2       ED Discharge Orders          Ordered    promethazine (PHENERGAN) 25  MG tablet  Every 6 hours PRN        08/20/21 0333             Discharge Instructions Discussed with and Provided to Patient:     Discharge Instructions      You were evaluated in the Emergency Department and after careful evaluation, we did not find any emergent condition requiring admission or further testing in the hospital.  Your exam/testing today was overall reassuring.  Recommend close follow-up with your regular doctors to further discuss your symptoms.  Can use the Phenergan provided for nausea.  Please return to the Emergency Department if you experience any worsening of your condition.  Thank you for allowing [place] to be a part of your care.        Maudie Flakes, MD 08/20/21 [phone]

## 2021-11-24 ENCOUNTER — Ambulatory Visit
Admission: EM | Admit: 2021-11-24 | Discharge: 2021-11-24 | Disposition: A | Discharge status: Home / Self Care | Payer: Medicaid Other | Attending: Family Medicine | Admitting: Family Medicine

## 2021-11-24 DIAGNOSIS — J0100 Acute maxillary sinusitis, unspecified: Secondary | ICD-10-CM

## 2021-11-24 MED ORDER — AMOXICILLIN-POT CLAVULANATE 875-125 MG PO TABS
1.0000 | ORAL_TABLET | Freq: Two times a day (BID) | ORAL | 0 refills | Status: DC
Start: 2021-11-24 — End: 2021-12-02

## 2021-11-24 NOTE — ED Triage Notes (Signed)
Pt states she has pressure and pain on the right side of her face ? ?Pt states her teeth and gums are in pain ? ?Pt states her throat and nose are burning  ? ?Pt states she tried Ibuprofen and tylenol without any relief ?

## 2021-11-24 NOTE — ED Provider Notes (Signed)
?[facility] ? ? ? ?CSN: [id number] ?Arrival date & time: 11/24/21  1721 ? ? ?  ? ?History   ?Chief Complaint ?Chief Complaint  ?Patient presents with  ? Facial Pain  ?  Mouth and facial pain  ? ? ?HPI ?[name] [name] Shown is a 26 y.o. female.  ? ?Presenting today with 2-week history of progressively worsening right-sided facial pain, pressure now radiating down to her teeth and gums, nasal passage burning, sore throat, headache, fatigue.  Denies cough, chest pain, shortness of breath, abdominal pain, nausea vomiting or diarrhea.  Trying ibuprofen and Tylenol with minimal relief. ? ? ?Past Medical History:  ?Diagnosis Date  ? Anxiety   ? Postpartum depression 02/19/2014  ? ? ?There are no problems to display for this patient. ? ? ?Past Surgical History:  ?Procedure Laterality Date  ? MYRINGOTOMY    ? TONSILLECTOMY    ? ? ?OB History   ? ? Gravida  ?2  ? Para  ?2  ? Term  ?2  ? Preterm  ?   ? AB  ?   ? Living  ?2  ?  ? ? SAB  ?   ? IAB  ?   ? Ectopic  ?   ? Multiple  ?0  ? Live Births  ?2  ?   ?  ?  ? ? ?Home Medications   ? ?Prior to Admission medications   ?Medication Sig Start Date End Date Taking? Authorizing Provider  ?amoxicillin-clavulanate (AUGMENTIN) 875-125 MG tablet Take 1 tablet by mouth every 12 (twelve) hours. 11/24/21  Yes Particia Nearing, PA-C  ?Alum & Mag Hydroxide-Simeth (GI COCKTAIL) SUSP suspension Take 30 mLs by mouth 2 (two) times daily as needed for indigestion. Shake well. 06/22/21   Geoffery Lyons, MD  ?azithromycin (ZITHROMAX) 250 MG tablet Take 1 tablet (250 mg total) by mouth daily. Take first 2 tablets together, then 1 every day until finished. 01/28/20   Avegno, Zachery Dakins, FNP  ?benzonatate (TESSALON) 100 MG capsule Take 1-2 capsules (100-200 mg total) by mouth 3 (three) times daily as needed for cough. 06/11/21   Wallis Bamberg, PA-C  ?buprenorphine-naloxone (SUBOXONE) 2-0.5 mg SUBL SL tablet Place 1 tablet under the tongue daily.    [provider]  ?cetirizine  (ZYRTEC ALLERGY) 10 MG tablet Take 1 tablet (10 mg total) by mouth daily. 06/11/21   Wallis Bamberg, PA-C  ?fluticasone (FLONASE) 50 MCG/ACT nasal spray Place 1 spray into both nostrils daily. 06/23/21   Caccavale, Sophia, PA-C  ?lidocaine (XYLOCAINE) 2 % solution Use as directed 10 mLs in the mouth or throat every 3 (three) hours as needed for mouth pain. 08/08/21   Particia Nearing, PA-C  ?medroxyPROGESTERone (DEPO-PROVERA) 150 MG/ML injection Inject 1 mL (150 mg total) into the muscle every 3 (three) months. 04/17/20   Adline Potter, NP  ?naproxen (NAPROSYN) 500 MG tablet Take 1 tablet (500 mg total) by mouth 2 (two) times daily with a meal. 06/23/21   Caccavale, Sophia, PA-C  ?ondansetron (ZOFRAN ODT) 4 MG disintegrating tablet Take 1 tablet (4 mg total) by mouth every 8 (eight) hours as needed for nausea or vomiting. 06/23/21   Caccavale, Sophia, PA-C  ?pantoprazole (PROTONIX) 20 MG tablet Take 1 tablet (20 mg total) by mouth daily. 06/22/21   Geoffery Lyons, MD  ?PARoxetine (PAXIL) 10 MG tablet Take 10 mg by mouth daily.    [provider]  ?predniSONE (DELTASONE) 20 MG tablet Take 2 tablets (40 mg total) by  mouth daily with breakfast. 08/08/21   Particia NearingLane, Kylar Leonhardt Elizabeth, PA-C  ?promethazine (PHENERGAN) 25 MG tablet Take 1 tablet (25 mg total) by mouth every 6 (six) hours as needed for nausea or vomiting. 08/20/21   Sabas SousBero, Michael M, MD  ?promethazine-dextromethorphan (PROMETHAZINE-DM) 6.25-15 MG/5ML syrup Take 5 mLs by mouth at bedtime as needed for cough. 06/11/21   Wallis BambergMani, Mario, PA-C  ?pseudoephedrine (SUDAFED) 30 MG tablet Take 1 tablet (30 mg total) by mouth every 8 (eight) hours as needed for congestion. 06/11/21   Wallis BambergMani, Mario, PA-C  ?sucralfate (CARAFATE) 1 g tablet Take 1 tablet (1 g total) by mouth 4 (four) times daily -  with meals and at bedtime. 06/22/21   Geoffery Lyonselo, Douglas, MD  ? ? ?Family History ?Family History  ?Problem Relation Age of Onset  ? Cancer Maternal Grandfather   ?     stomach,  throat  ? Diabetes Cousin   ? Alcohol abuse Neg Hx   ? Arthritis Neg Hx   ? Asthma Neg Hx   ? COPD Neg Hx   ? Birth defects Neg Hx   ? Depression Neg Hx   ? Drug abuse Neg Hx   ? Early death Neg Hx   ? Hearing loss Neg Hx   ? Heart disease Neg Hx   ? Hyperlipidemia Neg Hx   ? Hypertension Neg Hx   ? Kidney disease Neg Hx   ? Learning disabilities Neg Hx   ? Mental illness Neg Hx   ? Miscarriages / Stillbirths Neg Hx   ? Mental retardation Neg Hx   ? Vision loss Neg Hx   ? Stroke Neg Hx   ? Varicose Veins Neg Hx   ? ? ?Social History ?Social History  ? ?Tobacco Use  ? Smoking status: Every Day  ?  Packs/day: 0.50  ?  Years: 2.00  ?  Pack years: 1.00  ?  Types: Cigarettes  ?  Last attempt to quit: 03/10/2016  ?  Years since quitting: 5.7  ?  Passive exposure: Never  ? Smokeless tobacco: Never  ?Vaping Use  ? Vaping Use: Never used  ?Substance Use Topics  ? Alcohol use: No  ? Drug use: No  ? ? ? ?Allergies   ?Patient has no known allergies. ? ? ?Review of Systems ?Review of Systems ?Per HPI ? ?Physical Exam ?Triage Vital Signs ?ED Triage Vitals  ?Enc Vitals Group  ?   BP 11/24/21 1823 104/70  ?   Pulse Rate 11/24/21 1823 76  ?   Resp 11/24/21 1823 18  ?   Temp 11/24/21 1823 97.8 ?F (36.6 ?C)  ?   Temp Source 11/24/21 1823 Oral  ?   SpO2 11/24/21 1823 97 %  ?   Weight --   ?   Height --   ?   Head Circumference --   ?   Peak Flow --   ?   Pain Score 11/24/21 1819 10  ?   Pain Loc --   ?   Pain Edu? --   ?   Excl. in GC? --   ? ?No data found. ? ?Updated Vital Signs ?BP 104/70 (BP Location: Right Arm)   Pulse 76   Temp 97.8 ?F (36.6 ?C) (Oral)   Resp 18   SpO2 97%  ? ?Visual Acuity ?Right Eye Distance:   ?Left Eye Distance:   ?Bilateral Distance:   ? ?Right Eye Near:   ?Left Eye Near:    ?Bilateral Near:    ? ?  Physical Exam ?Vitals and nursing note reviewed.  ?Constitutional:   ?   Appearance: Normal appearance.  ?HENT:  ?   Head: Atraumatic.  ?   Right Ear: Tympanic membrane and external ear normal.  ?   Left Ear:  Tympanic membrane and external ear normal.  ?   Nose: Congestion present.  ?   Comments: Significant right sinus tenderness to palpation ?   Mouth/Throat:  ?   Mouth: Mucous membranes are moist.  ?   Pharynx: Posterior oropharyngeal erythema present.  ?Eyes:  ?   Extraocular Movements: Extraocular movements intact.  ?   Conjunctiva/sclera: Conjunctivae normal.  ?Cardiovascular:  ?   Rate and Rhythm: Normal rate and regular rhythm.  ?   Heart sounds: Normal heart sounds.  ?Pulmonary:  ?   Effort: Pulmonary effort is normal.  ?   Breath sounds: Normal breath sounds. No wheezing or rales.  ?Musculoskeletal:     ?   General: Normal range of motion.  ?   Cervical back: Normal range of motion and neck supple.  ?Skin: ?   General: Skin is warm and dry.  ?Neurological:  ?   Mental Status: She is alert and oriented to person, place, and time.  ?Psychiatric:     ?   Mood and Affect: Mood normal.     ?   Thought Content: Thought content normal.  ? ? ?UC Treatments / Results  ?Labs ?(all labs ordered are listed, but only abnormal results are displayed) ?Labs Reviewed - No data to display ? ?EKG ? ? ?Radiology ?No results found. ? ?Procedures ?Procedures (including critical care time) ? ?Medications Ordered in UC ?Medications - No data to display ? ?Initial Impression / Assessment and Plan / UC Course  ?I have reviewed the triage vital signs and the nursing notes. ? ?Pertinent labs & imaging results that were available during my care of the patient were reviewed by me and considered in my medical decision making (see chart for details). ? ?  ? ?Vital signs reassuring, suspect bacterial sinusitis.  Treat with Augmentin, Mucinex, sinus rinses, nasal sprays.  Return for worsening symptoms. ? ?Final Clinical Impressions(s) / UC Diagnoses  ? ?Final diagnoses:  ?Acute non-recurrent maxillary sinusitis  ? ?Discharge Instructions   ?None ?  ? ?ED Prescriptions   ? ? Medication Sig Dispense Auth. Provider  ? amoxicillin-clavulanate  (AUGMENTIN) 875-125 MG tablet Take 1 tablet by mouth every 12 (twelve) hours. 20 tablet Particia Nearing, [place]  ? ?  ? ?PDMP not reviewed this encounter. ?  ?Particia Nearing, PA-C ?11/24/21 1929 ? ?

## 2021-12-02 ENCOUNTER — Encounter (HOSPITAL_COMMUNITY): Payer: Self-pay | Admitting: *Deleted

## 2021-12-02 ENCOUNTER — Other Ambulatory Visit: Payer: Self-pay

## 2021-12-02 ENCOUNTER — Emergency Department (HOSPITAL_COMMUNITY): Payer: Medicaid Other

## 2021-12-02 ENCOUNTER — Emergency Department (HOSPITAL_COMMUNITY)
Admission: EM | Admit: 2021-12-02 | Discharge: 2021-12-02 | Disposition: A | Discharge status: Home / Self Care | Payer: Medicaid Other | Attending: Student | Admitting: Student

## 2021-12-02 DIAGNOSIS — J3489 Other specified disorders of nose and nasal sinuses: Secondary | ICD-10-CM

## 2021-12-02 DIAGNOSIS — F17210 Nicotine dependence, cigarettes, uncomplicated: Secondary | ICD-10-CM | POA: Diagnosis not present

## 2021-12-02 DIAGNOSIS — R519 Headache, unspecified: Secondary | ICD-10-CM | POA: Insufficient documentation

## 2021-12-02 LAB — I-STAT CHEM 8, ED
BUN: 9 mg/dL (ref 6–20)
Calcium, Ion: 1.15 mmol/L (ref 1.15–1.40)
Chloride: 100 mmol/L (ref 98–111)
Creatinine, Ser: 0.5 mg/dL (ref 0.44–1.00)
Glucose, Bld: 93 mg/dL (ref 70–99)
HCT: 39 % (ref 36.0–46.0)
Hemoglobin: 13.3 g/dL (ref 12.0–15.0)
Potassium: 3.6 mmol/L (ref 3.5–5.1)
Sodium: 139 mmol/L (ref 135–145)
TCO2: 29 mmol/L (ref 22–32)

## 2021-12-02 MED ORDER — IOHEXOL 300 MG/ML  SOLN
100.0000 mL | Freq: Once | INTRAMUSCULAR | Status: AC | PRN
  Administered 2021-12-02: 75 mL via INTRAVENOUS

## 2021-12-02 MED ORDER — KETOROLAC TROMETHAMINE 15 MG/ML IJ SOLN
15.0000 mg | Freq: Once | INTRAMUSCULAR | Status: AC
Start: 2021-12-02 — End: 2021-12-02
  Administered 2021-12-02: 15 mg via INTRAVENOUS
  Filled 2021-12-02: qty 1

## 2021-12-02 MED ORDER — CEFDINIR 300 MG PO CAPS: 300.0000 mg | ORAL_CAPSULE | Freq: Two times a day (BID) | ORAL | 0 refills | Status: AC

## 2021-12-02 MED ORDER — NAPROXEN 375 MG PO TABS: 375.0000 mg | ORAL_TABLET | Freq: Two times a day (BID) | ORAL | 0 refills | Status: AC

## 2021-12-02 NOTE — ED Triage Notes (Signed)
Pt with right facial pain.  Seen at [facility] and started on antibiotic for a sinus infection a week ago.  Has not helped with pain. Nasal congestion and sore throat.  ?

## 2021-12-02 NOTE — ED Provider Notes (Signed)
?[facility] EMERGENCY DEPARTMENT ?Provider Note ? ?CSN: [id number] ?Arrival date & time: 12/02/21 1427 ? ?Chief Complaint(s) ?Facial Pain ? ?HPI ?[name] [name] is a 26 y.o. female who presents emergency department for evaluation of right-sided facial pain.  Patient states that she does have a history of insufflation of oxycodone and uses daily.  Primarily through the right nostril.  She states that she was seen in urgent care last week where she was prescribed Augmentin but her symptoms are not getting better.  Pain is primarily under the orbit on the right with mild radiation down the jaw.  No triggering events to reproduce this pain other than direct palpation over the area.  Denies fever, chest pain, shortness of breath, abdominal pain, nausea, vomiting.  She does endorse some teeth sensitivity on that side as well.  States that last snorting of oxycodone was yesterday.  She states that this pain has been gradually worsening over the course of 1 month. ? ? ?Past Medical History ?Past Medical History:  ?Diagnosis Date  ? Anxiety   ? Postpartum depression 02/19/2014  ? ?There are no problems to display for this patient. ? ?Home Medication(s) ?Prior to Admission medications   ?Medication Sig Start Date End Date Taking? Authorizing Provider  ?Alum & Mag Hydroxide-Simeth (GI COCKTAIL) SUSP suspension Take 30 mLs by mouth 2 (two) times daily as needed for indigestion. Shake well. 06/22/21   Geoffery Lyons, MD  ?amoxicillin-clavulanate (AUGMENTIN) 875-125 MG tablet Take 1 tablet by mouth every 12 (twelve) hours. 11/24/21   Particia Nearing, PA-C  ?azithromycin (ZITHROMAX) 250 MG tablet Take 1 tablet (250 mg total) by mouth daily. Take first 2 tablets together, then 1 every day until finished. 01/28/20   Avegno, Zachery Dakins, FNP  ?benzonatate (TESSALON) 100 MG capsule Take 1-2 capsules (100-200 mg total) by mouth 3 (three) times daily as needed for cough. 06/11/21   Wallis Bamberg, PA-C  ?buprenorphine-naloxone  (SUBOXONE) 2-0.5 mg SUBL SL tablet Place 1 tablet under the tongue daily.    [provider]  ?cetirizine (ZYRTEC ALLERGY) 10 MG tablet Take 1 tablet (10 mg total) by mouth daily. 06/11/21   Wallis Bamberg, PA-C  ?fluticasone (FLONASE) 50 MCG/ACT nasal spray Place 1 spray into both nostrils daily. 06/23/21   Caccavale, Sophia, PA-C  ?lidocaine (XYLOCAINE) 2 % solution Use as directed 10 mLs in the mouth or throat every 3 (three) hours as needed for mouth pain. 08/08/21   Particia Nearing, PA-C  ?medroxyPROGESTERone (DEPO-PROVERA) 150 MG/ML injection Inject 1 mL (150 mg total) into the muscle every 3 (three) months. 04/17/20   Adline Potter, NP  ?naproxen (NAPROSYN) 500 MG tablet Take 1 tablet (500 mg total) by mouth 2 (two) times daily with a meal. 06/23/21   Caccavale, Sophia, PA-C  ?ondansetron (ZOFRAN ODT) 4 MG disintegrating tablet Take 1 tablet (4 mg total) by mouth every 8 (eight) hours as needed for nausea or vomiting. 06/23/21   Caccavale, Sophia, PA-C  ?pantoprazole (PROTONIX) 20 MG tablet Take 1 tablet (20 mg total) by mouth daily. 06/22/21   Geoffery Lyons, MD  ?PARoxetine (PAXIL) 10 MG tablet Take 10 mg by mouth daily.    [provider]  ?predniSONE (DELTASONE) 20 MG tablet Take 2 tablets (40 mg total) by mouth daily with breakfast. 08/08/21   Particia Nearing, PA-C  ?promethazine (PHENERGAN) 25 MG tablet Take 1 tablet (25 mg total) by mouth every 6 (six) hours as needed for nausea or vomiting. 08/20/21   Bero,  Elmer Sow, MD  ?promethazine-dextromethorphan (PROMETHAZINE-DM) 6.25-15 MG/5ML syrup Take 5 mLs by mouth at bedtime as needed for cough. 06/11/21   Wallis Bamberg, PA-C  ?pseudoephedrine (SUDAFED) 30 MG tablet Take 1 tablet (30 mg total) by mouth every 8 (eight) hours as needed for congestion. 06/11/21   Wallis Bamberg, PA-C  ?sucralfate (CARAFATE) 1 g tablet Take 1 tablet (1 g total) by mouth 4 (four) times daily -  with meals and at bedtime. 06/22/21   Geoffery Lyons, MD  ?                                                                                                                                   ?Past Surgical History ?Past Surgical History:  ?Procedure Laterality Date  ? MYRINGOTOMY    ? TONSILLECTOMY    ? ?Family History ?Family History  ?Problem Relation Age of Onset  ? Cancer Maternal Grandfather   ?     stomach, throat  ? Diabetes Cousin   ? Alcohol abuse Neg Hx   ? Arthritis Neg Hx   ? Asthma Neg Hx   ? COPD Neg Hx   ? Birth defects Neg Hx   ? Depression Neg Hx   ? Drug abuse Neg Hx   ? Early death Neg Hx   ? Hearing loss Neg Hx   ? Heart disease Neg Hx   ? Hyperlipidemia Neg Hx   ? Hypertension Neg Hx   ? Kidney disease Neg Hx   ? Learning disabilities Neg Hx   ? Mental illness Neg Hx   ? Miscarriages / Stillbirths Neg Hx   ? Mental retardation Neg Hx   ? Vision loss Neg Hx   ? Stroke Neg Hx   ? Varicose Veins Neg Hx   ? ? ?Social History ?Social History  ? ?Tobacco Use  ? Smoking status: Every Day  ?  Packs/day: 0.50  ?  Years: 2.00  ?  Pack years: 1.00  ?  Types: Cigarettes  ?  Last attempt to quit: 03/10/2016  ?  Years since quitting: 5.7  ?  Passive exposure: Never  ? Smokeless tobacco: Never  ?Vaping Use  ? Vaping Use: Never used  ?Substance Use Topics  ? Alcohol use: No  ? Drug use: No  ? ?Allergies ?Patient has no known allergies. ? ?Review of Systems ?Review of Systems  ?HENT:  Positive for dental problem, facial swelling and sinus pain.   ? ?Physical Exam ?Vital Signs  ?I have reviewed the triage vital signs ?BP 123/79 (BP Location: Right Arm)   Pulse (!) 106   Temp 98.4 ?F (36.9 ?C) (Temporal) Comment: pt had been drinking a soda  Resp 16   Ht  (1.651 m)   Wt 44.9 kg   SpO2 100%   BMI 16.47 kg/m?  ? ?Physical Exam ?Vitals and nursing note reviewed.  ?Constitutional:   ?   General: She is  not in acute distress. ?   Appearance: She is well-developed.  ?HENT:  ?   Head: Normocephalic and atraumatic.  ?   Comments: Significant tenderness over the maxillary  sinus on the right, erythema to the nasal canal right greater than left tenderness to teeth 2 through 4 but no appreciable abscess ?Eyes:  ?   Conjunctiva/sclera: Conjunctivae normal.  ?Cardiovascular:  ?   Rate and Rhythm: Normal rate and regular rhythm.  ?   Heart sounds: No murmur heard. ?Pulmonary:  ?   Effort: Pulmonary effort is normal. No respiratory distress.  ?   Breath sounds: Normal breath sounds.  ?Abdominal:  ?   Palpations: Abdomen is soft.  ?   Tenderness: There is no abdominal tenderness.  ?Musculoskeletal:     ?   General: No swelling.  ?   Cervical back: Neck supple.  ?Skin: ?   General: Skin is warm and dry.  ?   Capillary Refill: Capillary refill takes less than 2 seconds.  ?Neurological:  ?   Mental Status: She is alert.  ?Psychiatric:     ?   Mood and Affect: Mood normal.  ? ? ?ED Results and Treatments ?Labs ?(all labs ordered are listed, but only abnormal results are displayed) ?Labs Reviewed  ?I-STAT CHEM 8, ED  ?                                                                                                                       ? ?Radiology ?No results found. ? ?Pertinent labs & imaging results that were available during my care of the patient were reviewed by me and considered in my medical decision making (see MDM for details). ? ?Medications Ordered in ED ?Medications  ?ketorolac (TORADOL) 15 MG/ML injection 15 mg (has no administration in time range)  ?                                                               ?                                                                    ?Procedures ?Procedures ? ?(including critical care time) ? ?Medical Decision Making / ED Course ? ? ?This patient presents to the ED for concern of facial pain, this involves an extensive number of treatment options, and is a complaint that carries with it a high risk of complications and morbidity.  The differential diagnosis includes sinus infection, sinus abscess, septal necrosis, recurrent irritation  from insufflation ? ?MDM: ?Patient seen  emergency room for evaluation of right-sided facial pain.  Physical exam reveals Significant tenderness over the maxillary sinus on the right, erythema to the nasal canal right

## 2021-12-02 NOTE — Discharge Instructions (Signed)
You were seen in the emergency department for evaluation of frontal sinus pain.  I am concerned that this may be related to your snorting medication.  Please stop snorting medication.  We will change her antibiotic today and see if we can improve your sinus symptoms.  Your scan was reassuringly negative and you are safe for discharge at this time.  Please call your dentist for tooth eval. ?

## 2022-04-21 ENCOUNTER — Other Ambulatory Visit: Payer: Self-pay

## 2022-04-21 ENCOUNTER — Encounter (HOSPITAL_COMMUNITY): Payer: Self-pay

## 2022-04-21 ENCOUNTER — Emergency Department (HOSPITAL_COMMUNITY)
Admission: EM | Admit: 2022-04-21 | Discharge: 2022-04-21 | Discharge status: Against Medical Advice | Payer: Medicaid Other | Attending: Emergency Medicine | Admitting: Emergency Medicine

## 2022-04-21 DIAGNOSIS — Z5321 Procedure and treatment not carried out due to patient leaving prior to being seen by health care provider: Secondary | ICD-10-CM | POA: Diagnosis not present

## 2022-04-21 DIAGNOSIS — B9689 Other specified bacterial agents as the cause of diseases classified elsewhere: Secondary | ICD-10-CM | POA: Insufficient documentation

## 2022-04-21 DIAGNOSIS — R102 Pelvic and perineal pain: Secondary | ICD-10-CM | POA: Insufficient documentation

## 2022-04-21 DIAGNOSIS — N760 Acute vaginitis: Secondary | ICD-10-CM | POA: Insufficient documentation

## 2022-04-21 NOTE — ED Triage Notes (Signed)
Went to pcp a week ago for vaginal pain. Diagnosed with BV. States she feels worse now. Says its painful and swollen.

## 2022-06-21 IMAGING — CT CT MAXILLOFACIAL W/ CM
3 of 4 series · 15 of 47 positions shown, 18 images · IV contrast (agent unspecified)
Comparison: None

CLINICAL DATA: RIGHT facial pain, recently completed antibiotic
treatment for sinus infection, nasal congestion, sore throat

EXAM:
CT MAXILLOFACIAL WITH CONTRAST
TECHNIQUE: Multidetector CT imaging of the maxillofacial structures was
performed with intravenous contrast. Multiplanar CT image
reconstructions were also generated. Right side of face marked with
BB.

[Series 2: max soft · axial · 0.29mm/px · z∈[-92,+52]mm · 10 of 84 slices shown, 13 images]
[im 6/84  brain]
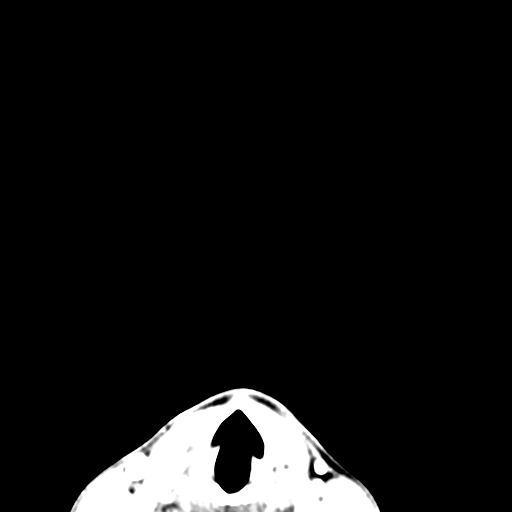
[im 6/84  bone]
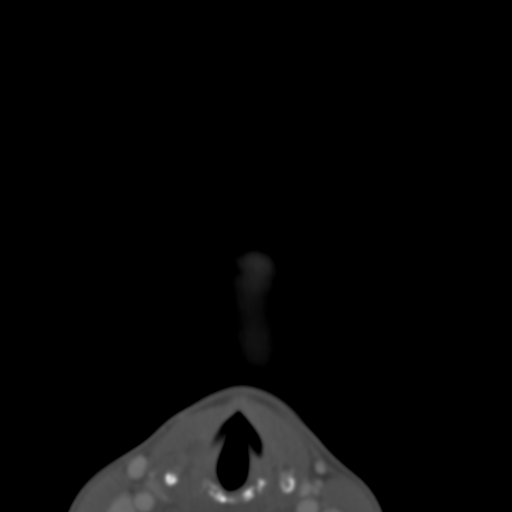
[im 15/84  bone]
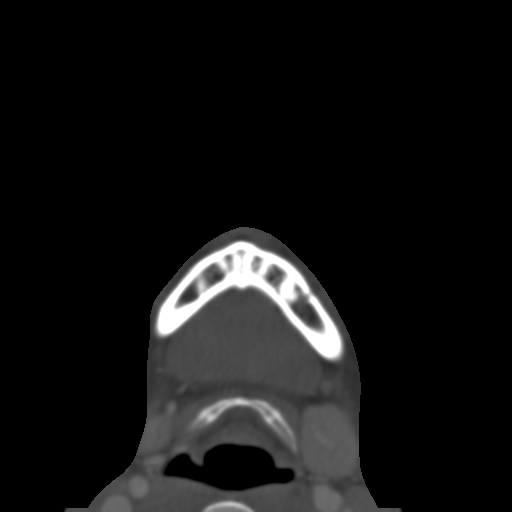
[im 23/84  bone]
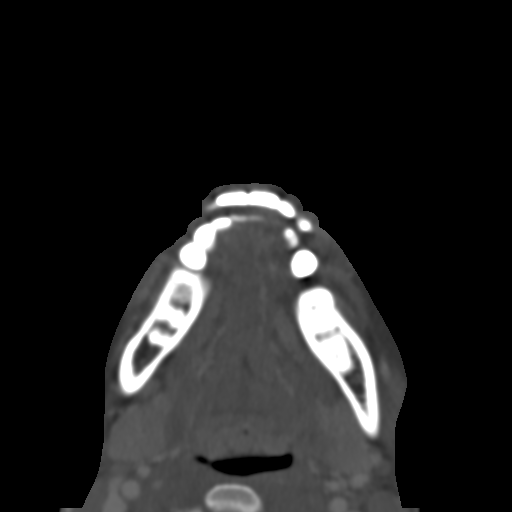
[im 29/84  bone]
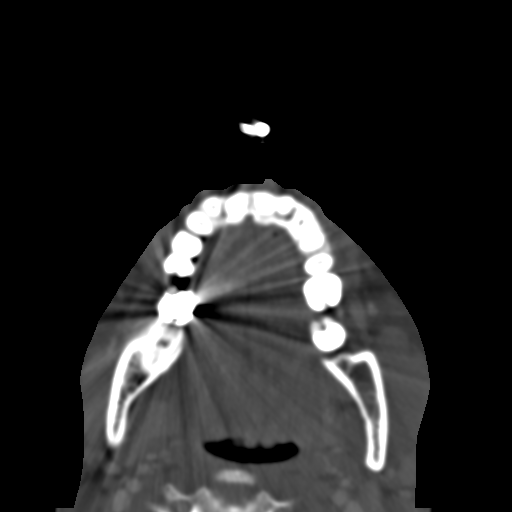
[im 38/84  brain]
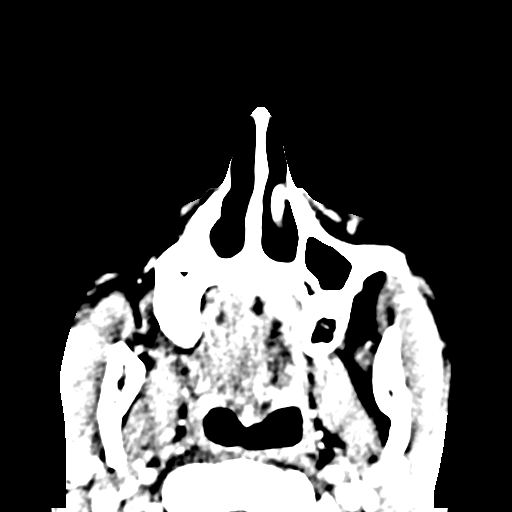
[im 38/84  bone]
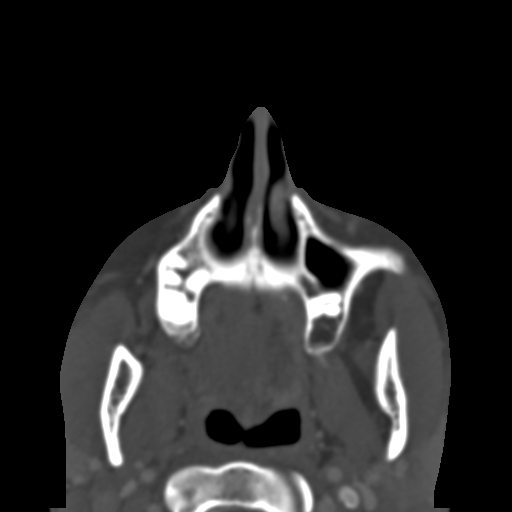
[im 46/84  bone]
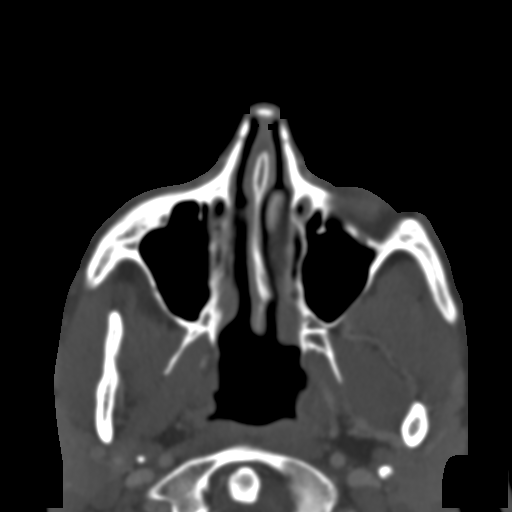
[im 55/84  bone]
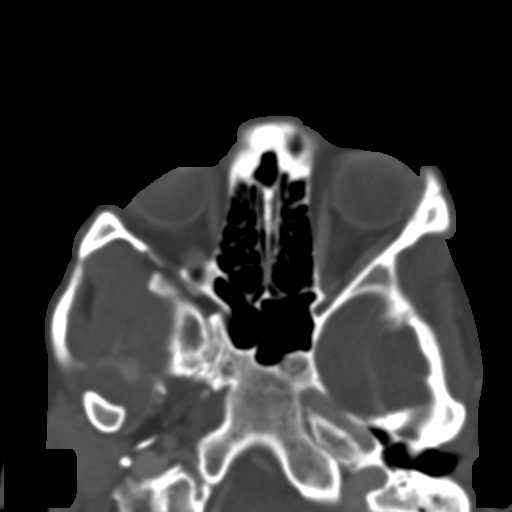
[im 63/84  bone]
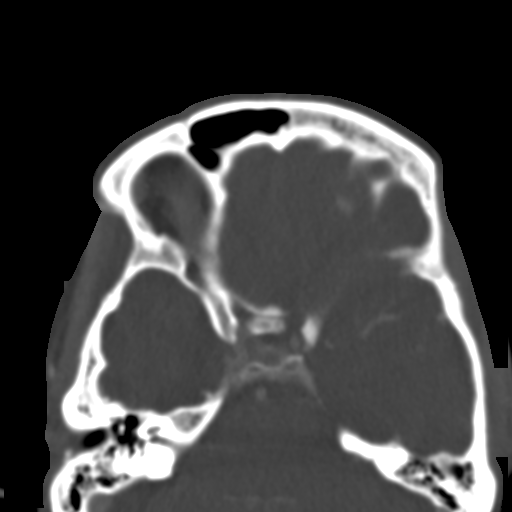
[im 69/84  brain]
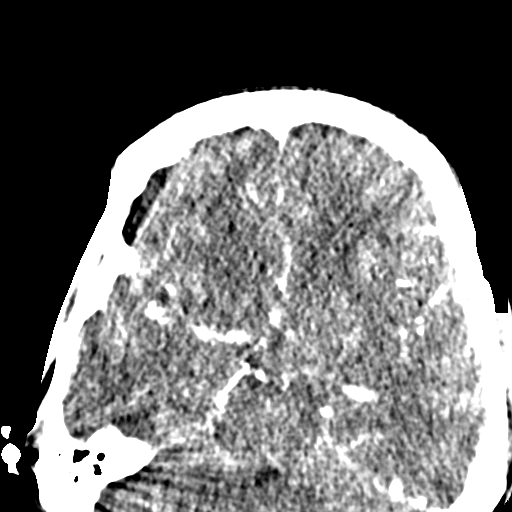
[im 69/84  bone]
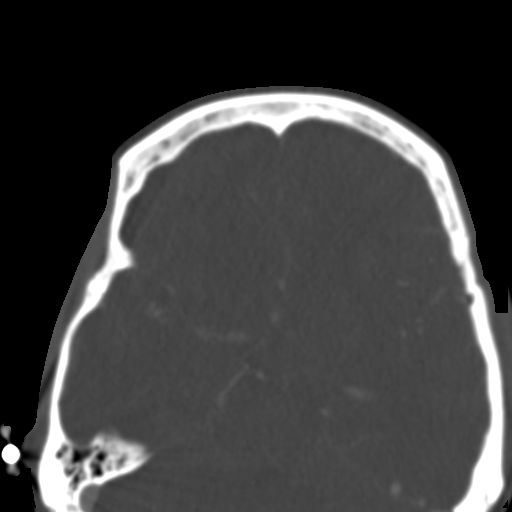
[im 78/84  bone]
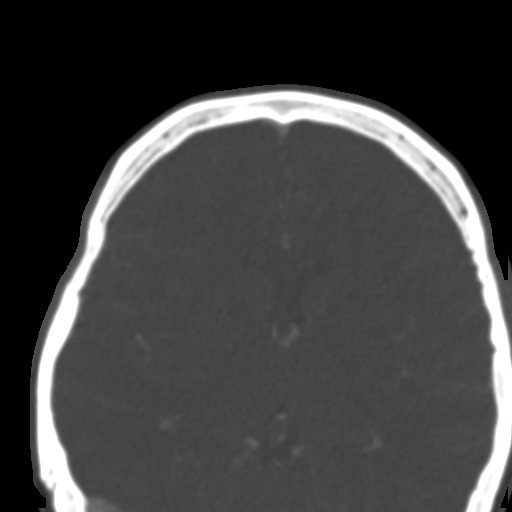

[Series 4: coronal soft · coronal · 0.36mm/px · 3 of 81 slices shown]
[im 27/81  bone]
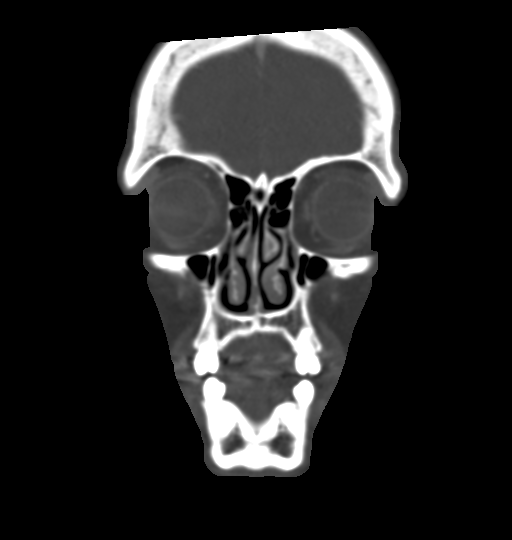
[im 36/81  bone]
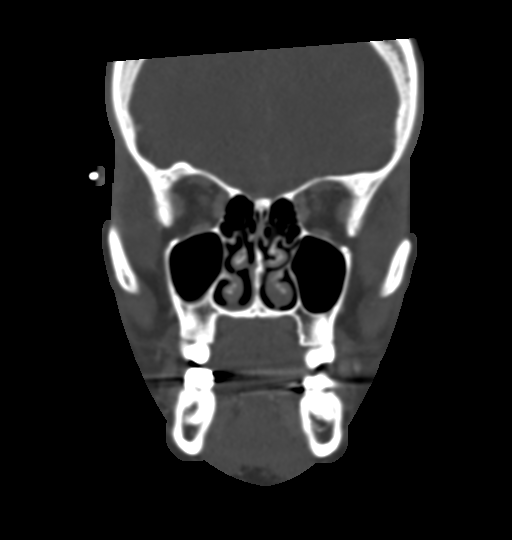
[im 45/81  bone]
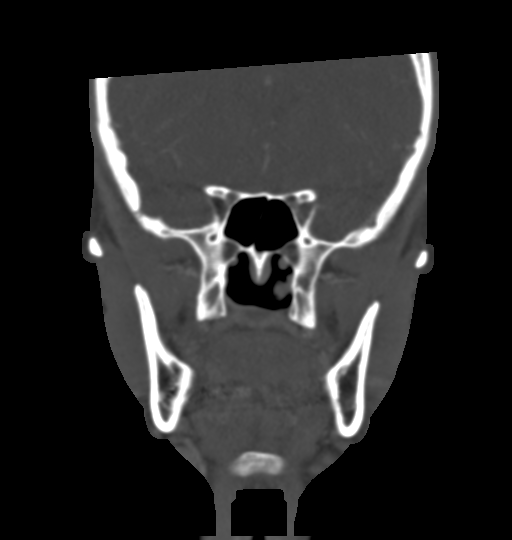

[Series 7: sagittal bone · sagittal · 0.28mm/px · 2 of 83 slices shown]
[im 28/83  bone]
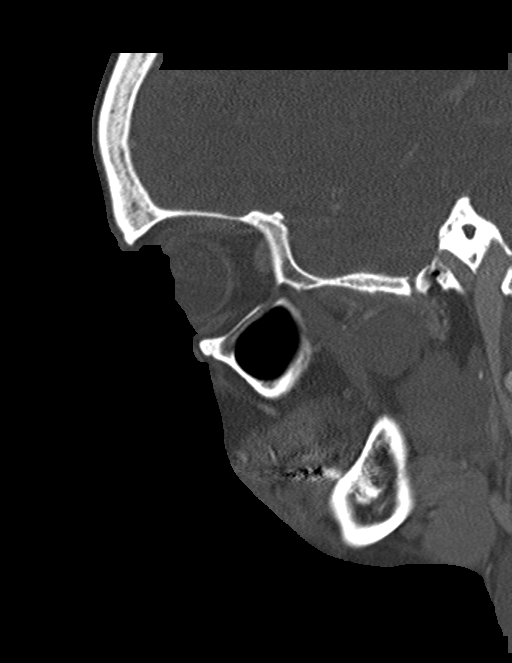
[im 55/83  bone]
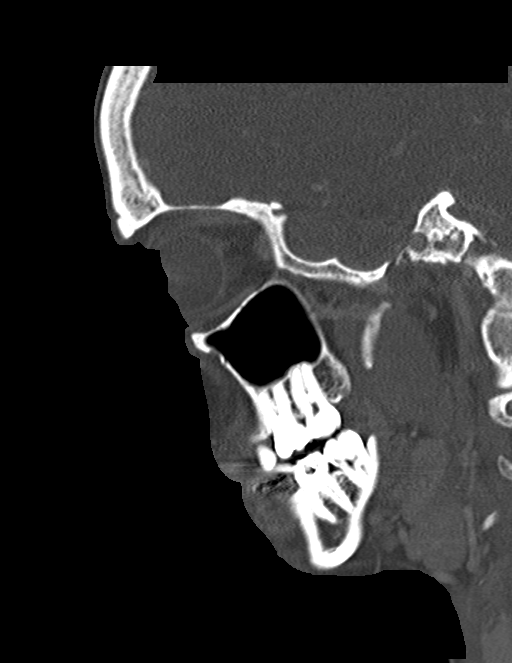

[15 of 47 positions shown; findings below may reference images not displayed]

RADIATION DOSE REDUCTION: This exam was performed according to the
departmental dose-optimization program which includes automated
exposure control, adjustment of the mA and/or kV according to
patient size and/or use of iterative reconstruction technique.

CONTRAST:  75mL OMNIPAQUE IOHEXOL 300 MG/ML  SOLN IV
FINDINGS: Osseous: Normal osseous structures.

Orbits: Intraorbital soft tissue planes clear. No orbital fluid or
pneumatosis.

Sinuses: Paranasal sinuses, mastoid air cells, and middle ear
cavities clear

Soft tissues: Facial soft tissues unremarkable. Parapharyngeal soft
tissues unremarkable. Prevertebral soft tissues normal thickness. No
adenopathy, mass, abscess, or fluid collection.

Limited intracranial: Unremarkable
IMPRESSION: Normal exam.

## 2022-07-24 ENCOUNTER — Encounter: Payer: Self-pay | Admitting: Emergency Medicine

## 2022-07-24 ENCOUNTER — Ambulatory Visit
Admission: EM | Admit: 2022-07-24 | Discharge: 2022-07-24 | Disposition: A | Discharge status: Home / Self Care | Payer: Medicaid Other | Attending: Family Medicine | Admitting: Family Medicine

## 2022-07-24 DIAGNOSIS — N390 Urinary tract infection, site not specified: Secondary | ICD-10-CM | POA: Insufficient documentation

## 2022-07-24 DIAGNOSIS — N898 Other specified noninflammatory disorders of vagina: Secondary | ICD-10-CM | POA: Diagnosis present

## 2022-07-24 LAB — POCT URINALYSIS DIP (MANUAL ENTRY)
Bilirubin, UA: NEGATIVE
Glucose, UA: 100 mg/dL — AB
Ketones, POC UA: NEGATIVE mg/dL
Nitrite, UA: POSITIVE — AB
Protein Ur, POC: NEGATIVE mg/dL
Spec Grav, UA: <=1.005 — AB (ref 1.010–1.025)
Urobilinogen, UA: 1 E.U./dL
pH, UA: 5 (ref 5.0–8.0)

## 2022-07-24 MED ORDER — FLUCONAZOLE 150 MG PO TABS
150.0000 mg | ORAL_TABLET | Freq: Once | ORAL | 0 refills | Status: AC
Start: 2022-07-24 — End: 2022-07-24

## 2022-07-24 MED ORDER — CEPHALEXIN 500 MG PO CAPS: 500.0000 mg | ORAL_CAPSULE | Freq: Two times a day (BID) | ORAL | 0 refills | Status: DC

## 2022-07-24 NOTE — ED Triage Notes (Signed)
Burning on urination x 1 day.  Recently has been treated for a UTI.

## 2022-07-26 LAB — URINE CULTURE: Culture: NO GROWTH

## 2022-07-27 ENCOUNTER — Telehealth (HOSPITAL_COMMUNITY): Payer: Self-pay | Admitting: Emergency Medicine

## 2022-07-27 LAB — CERVICOVAGINAL ANCILLARY ONLY
Bacterial Vaginitis (gardnerella): POSITIVE — AB
Candida Glabrata: NEGATIVE
Candida Vaginitis: NEGATIVE
Comment: NEGATIVE
Comment: NEGATIVE
Comment: NEGATIVE

## 2022-07-27 MED ORDER — METRONIDAZOLE 500 MG PO TABS: 500.0000 mg | ORAL_TABLET | Freq: Two times a day (BID) | ORAL | 0 refills | Status: DC

## 2022-07-28 NOTE — ED Provider Notes (Signed)
[facility]    CSN: [id number] Arrival date & time: 07/24/22  1856      History   Chief Complaint No chief complaint on file.   HPI [name] [name] Shown is a 26 y.o. female.   Presenting today with 1 day history of vaginal irritation and dysuria. Denies fever, chills, hematuria, pelvic or abdominal pain, flank pain. States she recently had antibiotics for a UTI with clearance of sxs up until now. No concern for STI or pregnancy.     Past Medical History:  Diagnosis Date   Anxiety    Postpartum depression 02/19/2014    There are no problems to display for this patient.   Past Surgical History:  Procedure Laterality Date   MYRINGOTOMY     TONSILLECTOMY      OB History     Gravida  2   Para  2   Term  2   Preterm      AB      Living  2      SAB      IAB      Ectopic      Multiple  0   Live Births  2            Home Medications    Prior to Admission medications   Medication Sig Start Date End Date Taking? Authorizing Provider  buPROPion (WELLBUTRIN) 75 MG tablet Take 75 mg by mouth 2 (two) times daily.   Yes [provider]  escitalopram (LEXAPRO) 10 MG tablet Take 10 mg by mouth daily.   Yes [provider]  norgestimate-ethinyl estradiol (ORTHO-CYCLEN) 0.25-35 MG-MCG tablet Take 1 tablet by mouth daily.   Yes [provider]  Alum & Mag Hydroxide-Simeth (GI COCKTAIL) SUSP suspension Take 30 mLs by mouth 2 (two) times daily as needed for indigestion. Shake well. 06/22/21   Geoffery Lyons, MD  azithromycin (ZITHROMAX) 250 MG tablet Take 1 tablet (250 mg total) by mouth daily. Take first 2 tablets together, then 1 every day until finished. 01/28/20   Avegno, Zachery Dakins, FNP  benzonatate (TESSALON) 100 MG capsule Take 1-2 capsules (100-200 mg total) by mouth 3 (three) times daily as needed for cough. 06/11/21   Wallis Bamberg, PA-C  buprenorphine-naloxone (SUBOXONE) 2-0.5 mg SUBL SL tablet Place 1 tablet under  the tongue daily.    [provider]  cetirizine (ZYRTEC ALLERGY) 10 MG tablet Take 1 tablet (10 mg total) by mouth daily. 06/11/21   Wallis Bamberg, PA-C  fluticasone (FLONASE) 50 MCG/ACT nasal spray Place 1 spray into both nostrils daily. 06/23/21   Caccavale, Sophia, PA-C  lidocaine (XYLOCAINE) 2 % solution Use as directed 10 mLs in the mouth or throat every 3 (three) hours as needed for mouth pain. 08/08/21   Particia Nearing, PA-C  medroxyPROGESTERone (DEPO-PROVERA) 150 MG/ML injection Inject 1 mL (150 mg total) into the muscle every 3 (three) months. 04/17/20   Adline Potter, NP  metroNIDAZOLE (FLAGYL) 500 MG tablet Take 1 tablet (500 mg total) by mouth 2 (two) times daily. 07/27/22   Merrilee Jansky, MD  naproxen (NAPROSYN) 375 MG tablet Take 1 tablet (375 mg total) by mouth 2 (two) times daily. 12/02/21   Kommor, Madison, MD  naproxen (NAPROSYN) 500 MG tablet Take 1 tablet (500 mg total) by mouth 2 (two) times daily with a meal. 06/23/21   Caccavale, Sophia, PA-C  ondansetron (ZOFRAN ODT) 4 MG disintegrating tablet Take 1 tablet (4 mg total) by mouth  every 8 (eight) hours as needed for nausea or vomiting. 06/23/21   Caccavale, Sophia, PA-C  pantoprazole (PROTONIX) 20 MG tablet Take 1 tablet (20 mg total) by mouth daily. 06/22/21   Geoffery Lyons, MD  PARoxetine (PAXIL) 10 MG tablet Take 10 mg by mouth daily.    [provider]  predniSONE (DELTASONE) 20 MG tablet Take 2 tablets (40 mg total) by mouth daily with breakfast. 08/08/21   Particia Nearing, PA-C  promethazine (PHENERGAN) 25 MG tablet Take 1 tablet (25 mg total) by mouth every 6 (six) hours as needed for nausea or vomiting. 08/20/21   Sabas Sous, MD  promethazine-dextromethorphan (PROMETHAZINE-DM) 6.25-15 MG/5ML syrup Take 5 mLs by mouth at bedtime as needed for cough. 06/11/21   Wallis Bamberg, PA-C  pseudoephedrine (SUDAFED) 30 MG tablet Take 1 tablet (30 mg total) by mouth every 8 (eight) hours as  needed for congestion. 06/11/21   Wallis Bamberg, PA-C  sucralfate (CARAFATE) 1 g tablet Take 1 tablet (1 g total) by mouth 4 (four) times daily -  with meals and at bedtime. 06/22/21   Geoffery Lyons, MD    Family History Family History  Problem Relation Age of Onset   Cancer Maternal Grandfather        stomach, throat   Diabetes Cousin    Alcohol abuse Neg Hx    Arthritis Neg Hx    Asthma Neg Hx    COPD Neg Hx    Birth defects Neg Hx    Depression Neg Hx    Drug abuse Neg Hx    Early death Neg Hx    Hearing loss Neg Hx    Heart disease Neg Hx    Hyperlipidemia Neg Hx    Hypertension Neg Hx    Kidney disease Neg Hx    Learning disabilities Neg Hx    Mental illness Neg Hx    Miscarriages / Stillbirths Neg Hx    Mental retardation Neg Hx    Vision loss Neg Hx    Stroke Neg Hx    Varicose Veins Neg Hx     Social History Social History   Tobacco Use   Smoking status: Every Day    Packs/day: 0.50    Years: 2.00    Total pack years: 1.00    Types: Cigarettes    Last attempt to quit: 03/10/2016    Years since quitting: 6.3    Passive exposure: Never   Smokeless tobacco: Never  Vaping Use   Vaping Use: Never used  Substance Use Topics   Alcohol use: No   Drug use: No     Allergies   Patient has no known allergies.   Review of Systems Review of Systems PER HPI  Physical Exam Triage Vital Signs ED Triage Vitals  Enc Vitals Group     BP 07/24/22 1956 124/88     Pulse Rate 07/24/22 1956 82     Resp 07/24/22 1956 18     Temp 07/24/22 1956 98.1 F (36.7 C)     Temp Source 07/24/22 1956 Oral     SpO2 07/24/22 1956 94 %     Weight --      Height --      Head Circumference --      Peak Flow --      Pain Score 07/24/22 1957 10     Pain Loc --      Pain Edu? --      Excl. in GC? --  No data found.  Updated Vital Signs BP 124/88 (BP Location: Right Arm)   Pulse 82   Temp 98.1 F (36.7 C) (Oral)   Resp 18   LMP 07/23/2022 (Exact Date)   SpO2 94%    Visual Acuity Right Eye Distance:   Left Eye Distance:   Bilateral Distance:    Right Eye Near:   Left Eye Near:    Bilateral Near:     Physical Exam Vitals and nursing note reviewed.  Constitutional:      Appearance: Normal appearance. She is not ill-appearing.  HENT:     Head: Atraumatic.  Eyes:     Extraocular Movements: Extraocular movements intact.     Conjunctiva/sclera: Conjunctivae normal.  Cardiovascular:     Rate and Rhythm: Normal rate and regular rhythm.     Heart sounds: Normal heart sounds.  Pulmonary:     Effort: Pulmonary effort is normal.     Breath sounds: Normal breath sounds.  Abdominal:     General: Bowel sounds are normal. There is no distension.     Palpations: Abdomen is soft.     Tenderness: There is no abdominal tenderness. There is no right CVA tenderness, left CVA tenderness or guarding.  Musculoskeletal:        General: Normal range of motion.     Cervical back: Normal range of motion and neck supple.  Skin:    General: Skin is warm and dry.  Neurological:     Mental Status: She is alert and oriented to person, place, and time.  Psychiatric:        Mood and Affect: Mood normal.        Thought Content: Thought content normal.        Judgment: Judgment normal.      UC Treatments / Results  Labs (all labs ordered are listed, but only abnormal results are displayed) Labs Reviewed  POCT URINALYSIS DIP (MANUAL ENTRY) - Abnormal; Notable for the following components:      Result Value   Color, UA orange (*)    Glucose, UA =100 (*)    Spec Grav, UA <=1.005 (*)    Blood, UA trace-intact (*)    Nitrite, UA Positive (*)    Leukocytes, UA Large (3+) (*)    All other components within normal limits  CERVICOVAGINAL ANCILLARY ONLY - Abnormal; Notable for the following components:   Bacterial Vaginitis (gardnerella) Positive (*)    All other components within normal limits  URINE CULTURE    EKG   Radiology No results  found.  Procedures Procedures (including critical care time)  Medications Ordered in UC Medications - No data to display  Initial Impression / Assessment and Plan / UC Course  I have reviewed the triage vital signs and the nursing notes.  Pertinent labs & imaging results that were available during my care of the patient were reviewed by me and considered in my medical decision making (see chart for details).     U/A with evidence of UTI, treat with keflex and await urine culture and adjust if needed. Diflucan also sent as she states she is prone to UTIs, adjust if needed based on vaginal swab results. Supportive home care reviewed.  Final Clinical Impressions(s) / UC Diagnoses   Final diagnoses:  Acute lower UTI  Vaginal irritation   Discharge Instructions   None    ED Prescriptions     Medication Sig Dispense Auth. Provider   cephALEXin (KEFLEX) 500 MG capsule Take 1  capsule (500 mg total) by mouth 2 (two) times daily. 10 capsule Particia NearingLane, Kroy Sprung Elizabeth, PA-C   fluconazole (DIFLUCAN) 150 MG tablet Take 1 tablet (150 mg total) by mouth once for 1 dose. 1 tablet Particia NearingLane, Neoma Uhrich [place], [place]      PDMP not reviewed this encounter.   Particia NearingLane, Aldan Camey [place], [place] 07/28/22 2151

## 2022-09-19 ENCOUNTER — Encounter (HOSPITAL_COMMUNITY): Payer: Self-pay | Admitting: Emergency Medicine

## 2022-09-19 ENCOUNTER — Other Ambulatory Visit: Payer: Self-pay

## 2022-09-19 ENCOUNTER — Emergency Department (HOSPITAL_COMMUNITY)
Admission: EM | Admit: 2022-09-19 | Discharge: 2022-09-19 | Disposition: A | Discharge status: Home / Self Care | Payer: Medicaid Other | Attending: Emergency Medicine | Admitting: Emergency Medicine

## 2022-09-19 DIAGNOSIS — R202 Paresthesia of skin: Secondary | ICD-10-CM | POA: Insufficient documentation

## 2022-09-19 DIAGNOSIS — R200 Anesthesia of skin: Secondary | ICD-10-CM | POA: Insufficient documentation

## 2022-09-19 DIAGNOSIS — R531 Weakness: Secondary | ICD-10-CM | POA: Insufficient documentation

## 2022-09-19 MED ORDER — LORAZEPAM 1 MG PO TABS
1.0000 mg | ORAL_TABLET | Freq: Once | ORAL | Status: AC
  Administered 2022-09-19: 1 mg via ORAL
  Filled 2022-09-19: qty 1

## 2022-09-19 NOTE — Discharge Instructions (Signed)
Your arm being numb is probably because you are putting pressure on the nerve when you are lying on it.  It should continue to get better as the day goes on.  If it does not get back to normal, please follow-up with the neurologist.

## 2022-09-19 NOTE — ED Triage Notes (Signed)
Pt states she woke up with numbness to her L arm and hand. States she cannot move arm and that it has been this way for almost 2 hrs.

## 2022-09-19 NOTE — ED Notes (Signed)
ED Provider at bedside. 

## 2022-09-19 NOTE — ED Provider Notes (Signed)
[facility] Provider Note   CSN: [id number] Arrival date & time: 09/19/22  [id number]     History  Chief Complaint  Patient presents with   L arm and Hand Numbness    [name] [name] is a 27 y.o. female.  The history is provided by the patient.  She has history of postpartum depression and comes in because she woke up and noted that her entire left arm was numb and she was not able to move it.  She is fine when she went to bed.  She has had this happen before, but it usually gets better within a minute or 2.  It has been 2 hours and she has not noticed any improvement.  She denies any neck pain, shoulder pain.  She denies any weakness or numbness in her left leg, anywhere in the right side of her body or in her face.   Home Medications Prior to Admission medications   Medication Sig Start Date End Date Taking? Authorizing Provider  Alum & Mag Hydroxide-Simeth (GI COCKTAIL) SUSP suspension Take 30 mLs by mouth 2 (two) times daily as needed for indigestion. Shake well. 06/22/21   Veryl Speak, MD  azithromycin (ZITHROMAX) 250 MG tablet Take 1 tablet (250 mg total) by mouth daily. Take first 2 tablets together, then 1 every day until finished. 01/28/20   Avegno, Darrelyn Hillock, FNP  benzonatate (TESSALON) 100 MG capsule Take 1-2 capsules (100-200 mg total) by mouth 3 (three) times daily as needed for cough. 06/11/21   Jaynee Eagles, PA-C  buprenorphine-naloxone (SUBOXONE) 2-0.5 mg SUBL SL tablet Place 1 tablet under the tongue daily.    [provider]  buPROPion (WELLBUTRIN) 75 MG tablet Take 75 mg by mouth 2 (two) times daily.    [provider]  cetirizine (ZYRTEC ALLERGY) 10 MG tablet Take 1 tablet (10 mg total) by mouth daily. 06/11/21   Jaynee Eagles, PA-C  escitalopram (LEXAPRO) 10 MG tablet Take 10 mg by mouth daily.    [provider]  fluticasone (FLONASE) 50 MCG/ACT nasal spray Place 1 spray into both nostrils daily.  06/23/21   Caccavale, Sophia, PA-C  lidocaine (XYLOCAINE) 2 % solution Use as directed 10 mLs in the mouth or throat every 3 (three) hours as needed for mouth pain. 08/08/21   Volney American, PA-C  medroxyPROGESTERone (DEPO-PROVERA) 150 MG/ML injection Inject 1 mL (150 mg total) into the muscle every 3 (three) months. 04/17/20   Estill Dooms, NP  metroNIDAZOLE (FLAGYL) 500 MG tablet Take 1 tablet (500 mg total) by mouth 2 (two) times daily. 07/27/22   Chase Picket, MD  naproxen (NAPROSYN) 375 MG tablet Take 1 tablet (375 mg total) by mouth 2 (two) times daily. 12/02/21   Kommor, Madison, MD  naproxen (NAPROSYN) 500 MG tablet Take 1 tablet (500 mg total) by mouth 2 (two) times daily with a meal. 06/23/21   Caccavale, Sophia, PA-C  norgestimate-ethinyl estradiol (ORTHO-CYCLEN) 0.25-35 MG-MCG tablet Take 1 tablet by mouth daily.    [provider]  ondansetron (ZOFRAN ODT) 4 MG disintegrating tablet Take 1 tablet (4 mg total) by mouth every 8 (eight) hours as needed for nausea or vomiting. 06/23/21   Caccavale, Sophia, PA-C  pantoprazole (PROTONIX) 20 MG tablet Take 1 tablet (20 mg total) by mouth daily. 06/22/21   Veryl Speak, MD  PARoxetine (PAXIL) 10 MG tablet Take 10 mg by mouth daily.    [provider]  predniSONE (DELTASONE)  20 MG tablet Take 2 tablets (40 mg total) by mouth daily with breakfast. 08/08/21   Volney American, PA-C  promethazine (PHENERGAN) 25 MG tablet Take 1 tablet (25 mg total) by mouth every 6 (six) hours as needed for nausea or vomiting. 08/20/21   Maudie Flakes, MD  promethazine-dextromethorphan (PROMETHAZINE-DM) 6.25-15 MG/5ML syrup Take 5 mLs by mouth at bedtime as needed for cough. 06/11/21   Jaynee Eagles, PA-C  pseudoephedrine (SUDAFED) 30 MG tablet Take 1 tablet (30 mg total) by mouth every 8 (eight) hours as needed for congestion. 06/11/21   Jaynee Eagles, PA-C  sucralfate (CARAFATE) 1 g tablet Take 1 tablet (1 g total) by mouth 4  (four) times daily -  with meals and at bedtime. 06/22/21   Veryl Speak, MD      Allergies    Patient has no known allergies.    Review of Systems   Review of Systems  All other systems reviewed and are negative.   Physical Exam Updated Vital Signs BP 122/76 (BP Location: Right Arm)   Pulse 82   Temp 97.8 F (36.6 C) (Oral)   Resp 16   Ht 5' 3"$  (1.6 m)   Wt 47.6 kg   LMP 09/05/2022   SpO2 97%   BMI 18.60 kg/m  Physical Exam Vitals and nursing note reviewed.   27 year old female, anxious and crying, but in no acute distress. Vital signs are normal. Oxygen saturation is 97%, which is normal. Head is normocephalic and atraumatic. PERRLA, EOMI. Oropharynx is clear. Neck is nontender and supple without adenopathy. Back is nontender and there is no CVA tenderness. Lungs are clear without rales, wheezes, or rhonchi. Chest is nontender. Heart has regular rate and rhythm without murmur. Abdomen is soft, flat, nontender. Extremities have no cyanosis or edema, full range of motion is present.  Radial pulse is 2+, capillary refill is prompt. Skin is warm and dry without rash. Neurologic: Emotionally labile but alert and oriented and with normal and appropriate speech, cranial nerves are intact, there is marked decrease sensation in the left arm distal to the proximal upper arm with weakness of the muscles of the left arm.  Sensation and motor strength is normal and her other 3 extremities.  ED Results / Procedures / Treatments    Procedures Procedures    Medications Ordered in ED Medications  LORazepam (ATIVAN) tablet 1 mg (has no administration in time range)    ED Course/ Medical Decision Making/ A&P                             Medical Decision Making Risk Prescription drug management.   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  No evidence of stroke, no indication for imaging or lab work.  She is very anxious and I ordered a dose of lorazepam.  I  will plan to observe her in the emergency department.  I have reevaluated the patient.  She has much more movement in the arm.  Sensation is improved but not completely back to baseline.  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  No indication for additional ED workup.  I am referring her to neurology if she does not get back to her baseline.  Final Clinical Impression(s) / ED Diagnoses Final diagnoses:  Paresthesia of left arm    Rx / DC Orders ED Discharge Orders     None  Delora Fuel, MD [id number]
